# Patient Record
Sex: Female | Born: 1960 | Race: Black or African American | Hispanic: No | Marital: Married | Smoking: Never smoker
Health system: Southern US, Community
[De-identification: ages and names within clinical notes are randomized; demographics above are authoritative.]

## PROBLEM LIST (undated history)

## (undated) DIAGNOSIS — N952 Postmenopausal atrophic vaginitis: Secondary | ICD-10-CM

## (undated) DIAGNOSIS — E785 Hyperlipidemia, unspecified: Secondary | ICD-10-CM

## (undated) DIAGNOSIS — I10 Essential (primary) hypertension: Secondary | ICD-10-CM

## (undated) HISTORY — DX: Postmenopausal atrophic vaginitis: N95.2

## (undated) HISTORY — DX: Hyperlipidemia, unspecified: E78.5

---

## 1998-02-27 ENCOUNTER — Other Ambulatory Visit: Admission: RE | Admit: 1998-02-27 | Discharge: 1998-02-27 | Payer: Self-pay | Admitting: Obstetrics and Gynecology

## 2011-10-28 ENCOUNTER — Other Ambulatory Visit (HOSPITAL_COMMUNITY)
Admission: RE | Admit: 2011-10-28 | Discharge: 2011-10-28 | Disposition: A | Discharge status: Home / Self Care | Payer: Managed Care, Other (non HMO) | Source: Ambulatory Visit | Attending: Family Medicine | Admitting: Family Medicine

## 2011-10-28 DIAGNOSIS — Z0000 Encounter for general adult medical examination without abnormal findings: Secondary | ICD-10-CM | POA: Insufficient documentation

## 2012-02-19 ENCOUNTER — Emergency Department (INDEPENDENT_AMBULATORY_CARE_PROVIDER_SITE_OTHER): Payer: Managed Care, Other (non HMO)

## 2012-02-19 ENCOUNTER — Emergency Department (HOSPITAL_BASED_OUTPATIENT_CLINIC_OR_DEPARTMENT_OTHER)
Admission: EM | Admit: 2012-02-19 | Discharge: 2012-02-19 | Disposition: A | Discharge status: Home / Self Care | Payer: Managed Care, Other (non HMO) | Attending: Emergency Medicine | Admitting: Emergency Medicine

## 2012-02-19 ENCOUNTER — Encounter (HOSPITAL_BASED_OUTPATIENT_CLINIC_OR_DEPARTMENT_OTHER): Payer: Self-pay | Admitting: *Deleted

## 2012-02-19 DIAGNOSIS — R112 Nausea with vomiting, unspecified: Secondary | ICD-10-CM | POA: Insufficient documentation

## 2012-02-19 DIAGNOSIS — N1330 Unspecified hydronephrosis: Secondary | ICD-10-CM

## 2012-02-19 DIAGNOSIS — N390 Urinary tract infection, site not specified: Secondary | ICD-10-CM | POA: Insufficient documentation

## 2012-02-19 DIAGNOSIS — R109 Unspecified abdominal pain: Secondary | ICD-10-CM

## 2012-02-19 DIAGNOSIS — N2889 Other specified disorders of kidney and ureter: Secondary | ICD-10-CM

## 2012-02-19 HISTORY — DX: Essential (primary) hypertension: I10

## 2012-02-19 LAB — DIFFERENTIAL
Basophils Relative: 0 % (ref 0–1)
Eosinophils Absolute: 0 10*3/uL (ref 0.0–0.7)
Monocytes Absolute: 0.8 10*3/uL (ref 0.1–1.0)
Monocytes Relative: 7 % (ref 3–12)
Neutrophils Relative %: 84 % — ABNORMAL HIGH (ref 43–77)

## 2012-02-19 LAB — URINALYSIS, ROUTINE W REFLEX MICROSCOPIC
Glucose, UA: NEGATIVE mg/dL
Ketones, ur: 15 mg/dL — AB
Protein, ur: NEGATIVE mg/dL
Urobilinogen, UA: 0.2 mg/dL (ref 0.0–1.0)

## 2012-02-19 LAB — URINE MICROSCOPIC-ADD ON

## 2012-02-19 LAB — CBC
Hemoglobin: 11.8 g/dL — ABNORMAL LOW (ref 12.0–15.0)
MCH: 28 pg (ref 26.0–34.0)
MCHC: 34.5 g/dL (ref 30.0–36.0)

## 2012-02-19 LAB — COMPREHENSIVE METABOLIC PANEL
Albumin: 4.4 g/dL (ref 3.5–5.2)
BUN: 11 mg/dL (ref 6–23)
Creatinine, Ser: 0.7 mg/dL (ref 0.50–1.10)
Potassium: 3.5 mEq/L (ref 3.5–5.1)
Total Protein: 7.9 g/dL (ref 6.0–8.3)

## 2012-02-19 LAB — LIPASE, BLOOD: Lipase: 22 U/L (ref 11–59)

## 2012-02-19 MED ORDER — FENTANYL CITRATE 0.05 MG/ML IJ SOLN
50.0000 ug | Freq: Once | INTRAMUSCULAR | Status: AC
  Administered 2012-02-19: 50 ug via INTRAVENOUS
  Filled 2012-02-19: qty 2

## 2012-02-19 MED ORDER — TRAMADOL HCL 50 MG PO TABS: 50.0000 mg | ORAL_TABLET | Freq: Four times a day (QID) | ORAL | Status: AC | PRN

## 2012-02-19 MED ORDER — CIPROFLOXACIN HCL 500 MG PO TABS: 500.0000 mg | ORAL_TABLET | Freq: Two times a day (BID) | ORAL | Status: AC

## 2012-02-19 NOTE — ED Notes (Signed)
Patient having abd pain mid abd and right flank area. Patient has some relief after vomiting.

## 2012-02-19 NOTE — ED Provider Notes (Signed)
History     CSN: [id number]  Arrival date & time 02/19/12  0138   First MD Initiated Contact with Patient 02/19/12 0200      Chief Complaint  Patient presents with  . Abdominal Pain    (Consider location/radiation/quality/duration/timing/severity/associated sxs/prior treatment) Patient is a 51 y.o. female presenting with abdominal pain. The history is provided by the patient. No language interpreter was used.  Abdominal Pain The primary symptoms of the illness include abdominal pain, nausea and vomiting. The primary symptoms of the illness do not include shortness of breath. The current episode started 3 to 5 hours ago. The onset of the illness was sudden. The problem has been gradually improving.  The abdominal pain is located in the right flank. The abdominal pain radiates to the periumbilical region. The severity of the abdominal pain is 10/10 (current 4-5). The abdominal pain is relieved by vomiting. Exacerbated by: nothing, also had an episode yesterday that resolved.  The illness is associated with eating. The patient states that she believes she is currently not pregnant. The patient has had a change in bowel habit (loose stool). Symptoms associated with the illness do not include chills or anorexia. Significant associated medical issues do not include GERD.    Past Medical History  Diagnosis Date  . Hypertension     History reviewed. No pertinent past surgical history.  No family history on file.  History  Substance Use Topics  . Smoking status: Never Smoker   . Smokeless tobacco: Not on file  . Alcohol Use:     OB History    Grav Para Term Preterm Abortions TAB SAB Ect Mult Living                  Review of Systems  Constitutional: Negative for chills.  HENT: Negative.   Eyes: Negative.   Respiratory: Negative.  Negative for shortness of breath.   Cardiovascular: Negative.  Negative for chest pain.  Gastrointestinal: Positive for nausea, vomiting and  abdominal pain. Negative for anorexia.  Genitourinary: Negative.   Neurological: Negative.   Hematological: Negative.   Psychiatric/Behavioral: Negative.     Allergies  Review of patient's allergies indicates no known allergies.  Home Medications   Current Outpatient Rx  Name Route Sig Dispense Refill  . LISINOPRIL 20 MG PO TABS Oral Take 20 mg by mouth daily.      BP 155/80  Pulse 108  Temp(Src) 98.7 F (37.1 C) (Oral)  SpO2 100%  Physical Exam  Constitutional: She is oriented to person, place, and time. She appears well-developed and well-nourished. No distress.  HENT:  Head: Normocephalic and atraumatic.  Mouth/Throat: Oropharynx is clear and moist.  Eyes: Conjunctivae are normal. Pupils are equal, round, and reactive to light.  Neck: Normal range of motion. Neck supple.  Cardiovascular: Normal rate and regular rhythm.   Pulmonary/Chest: Effort normal and breath sounds normal. She has no wheezes. She has no rales.  Abdominal: Soft. Bowel sounds are normal. There is no rebound and no guarding.    Musculoskeletal: Normal range of motion.  Neurological: She is alert and oriented to person, place, and time.  Skin: Skin is warm and dry.  Psychiatric: She has a normal mood and affect.    ED Course  Procedures (including critical care time)  Labs Reviewed  URINALYSIS, ROUTINE W REFLEX MICROSCOPIC - Abnormal; Notable for the following:    Hgb urine dipstick TRACE (*)    Ketones, ur 15 (*)    Leukocytes, UA SMALL (*)  All other components within normal limits  URINE MICROSCOPIC-ADD ON - Abnormal; Notable for the following:    Squamous Epithelial / LPF FEW (*)    Bacteria, UA MANY (*)    All other components within normal limits  PREGNANCY, URINE  CBC  DIFFERENTIAL  COMPREHENSIVE METABOLIC PANEL  LIPASE, BLOOD   No results found.   No diagnosis found.    MDM  Patient informed of hydronephrosis and that possibility is passed kidney stone, reflux or less  likely neoplasm and the need to follow up with urology.  Patient and husband verbalize understanding and agree to follow up.  Return for intractable pain vomiting or any concerns.          Jasmine Awe, MD 02/19/12 [phone]

## 2014-10-01 ENCOUNTER — Encounter: Payer: Self-pay | Admitting: Oncology

## 2014-10-01 NOTE — Progress Notes (Signed)
Faxed fmla form to [employer] @ [phone] °

## 2015-12-08 DIAGNOSIS — M25531 Pain in right wrist: Secondary | ICD-10-CM | POA: Insufficient documentation

## 2016-08-15 DIAGNOSIS — F419 Anxiety disorder, unspecified: Secondary | ICD-10-CM | POA: Insufficient documentation

## 2016-08-20 ENCOUNTER — Emergency Department (HOSPITAL_BASED_OUTPATIENT_CLINIC_OR_DEPARTMENT_OTHER)
Admission: EM | Admit: 2016-08-20 | Discharge: 2016-08-20 | Disposition: A | Discharge status: Home / Self Care | Payer: Managed Care, Other (non HMO) | Attending: Emergency Medicine | Admitting: Emergency Medicine

## 2016-08-20 ENCOUNTER — Encounter (HOSPITAL_BASED_OUTPATIENT_CLINIC_OR_DEPARTMENT_OTHER): Payer: Self-pay

## 2016-08-20 DIAGNOSIS — Z79899 Other long term (current) drug therapy: Secondary | ICD-10-CM | POA: Insufficient documentation

## 2016-08-20 DIAGNOSIS — I10 Essential (primary) hypertension: Secondary | ICD-10-CM | POA: Diagnosis not present

## 2016-08-20 NOTE — ED Notes (Signed)
Patient is alert and oriented x3.  She was given DC instructions and follow up visit instructions.  Patient gave verbal understanding. She was DC ambulatory under her own power to home.  V/S stable.  He was not showing any signs of distress on DC 

## 2016-08-20 NOTE — ED Provider Notes (Signed)
   [facility] Provider Note: Lowella DellJ. Lane Thanvi Blincoe, MD, FACEP  CSN: [id number] MRN: [id number] ARRIVAL: 08/20/16 at 0131   CHIEF COMPLAINT  Hypertension   HISTORY OF PRESENT ILLNESS  Jennifer Mercer is a 55 y.o. female with a history of hypertension. She recently had her antihypertensive dose increased. She awoke this morning just prior to arrival with a sharp occipital headache. It was not severe. It resolved after about 2 minutes and she is now asymptomatic. She became concerned about her blood pressure and wanted it checked. It was 165/96 on arrival. It was rechecked at 159/91. She denies blurred vision, numbness, weakness, nausea, vomiting, diarrhea, chest pain or shortness of breath. She has been taking over-the-counter cough/cold medication recently.   Past Medical History:  Diagnosis Date  . Hypertension     History reviewed. No pertinent surgical history.  No family history on file.  Social History  Substance Use Topics  . Smoking status: Never Smoker  . Smokeless tobacco: Never Used  . Alcohol use No    Prior to Admission medications   Medication Sig Start Date End Date Taking? Authorizing Provider  lisinopril (PRINIVIL,ZESTRIL) 20 MG tablet Take 20 mg by mouth daily.    Historical Provider, MD    Allergies Review of patient's allergies indicates no known allergies.   REVIEW OF SYSTEMS  Negative except as noted here or in the History of Present Illness.   PHYSICAL EXAMINATION  Initial Vital Signs Blood pressure 165/96, pulse 65, temperature 98.1 F (36.7 C), temperature source Oral, height 5' 6.5" (1.689 m), weight 181 lb (82.1 kg), SpO2 100 %.  Examination General: Well-developed, well-nourished female in no acute distress; appearance consistent with age of record HENT: normocephalic; atraumatic Eyes: pupils equal, round and reactive to light; extraocular muscles intact Neck: supple Heart: regular rate and rhythm; Lungs: clear to auscultation  bilaterally Abdomen: soft; nondistended; nontender; bowel sounds present Extremities: No deformity; full range of motion; pulses normal Neurologic: Awake, alert and oriented; motor function intact in all extremities and symmetric; no facial droop Skin: Warm and dry Psychiatric: Normal mood and affect   RESULTS  Summary of this visit's results, reviewed by myself:   EKG Interpretation  Date/Time:    Ventricular Rate:    PR Interval:    QRS Duration:   QT Interval:    QTC Calculation:   R Axis:     Text Interpretation:        Laboratory Studies: No results found for this or any previous visit (from the past 24 hour(s)). Imaging Studies: No results found.  ED COURSE  Nursing notes and initial vitals signs, including pulse oximetry, reviewed.  Vitals:   08/20/16 0138  BP: 165/96  Pulse: 65  Temp: 98.1 F (36.7 C)  TempSrc: Oral  SpO2: 100%  Weight: 181 lb (82.1 kg)  Height: 5' 6.5" (1.689 m)    PROCEDURES    ED DIAGNOSES     ICD-9-CM ICD-10-CM   1. Hypertension not at goal 401.9 I10        Paula LibraJohn Ger Ringenberg, MD 08/20/16 [phone]

## 2016-08-20 NOTE — ED Triage Notes (Signed)
Pt states her PCP increased her b/p meds on 10/6, has been taking OTC cough/cold meds since Monday; pt states had a sharp pain to back of her head that woke her from her sleep lasting 2mins. Denies pain at this time just worried about her b/p; neg on stroke screen

## 2016-12-23 ENCOUNTER — Other Ambulatory Visit: Payer: Self-pay | Admitting: Nurse Practitioner

## 2016-12-23 DIAGNOSIS — Z1231 Encounter for screening mammogram for malignant neoplasm of breast: Secondary | ICD-10-CM

## 2016-12-27 ENCOUNTER — Ambulatory Visit
Admission: RE | Admit: 2016-12-27 | Discharge: 2016-12-27 | Disposition: A | Discharge status: Home / Self Care | Payer: Managed Care, Other (non HMO) | Source: Ambulatory Visit | Attending: Nurse Practitioner | Admitting: Nurse Practitioner

## 2016-12-27 DIAGNOSIS — Z1231 Encounter for screening mammogram for malignant neoplasm of breast: Secondary | ICD-10-CM

## 2018-10-19 ENCOUNTER — Ambulatory Visit: Payer: 59 | Admitting: Internal Medicine

## 2018-10-19 ENCOUNTER — Encounter: Payer: Self-pay | Admitting: Internal Medicine

## 2018-10-19 VITALS — BP 130/78 | HR 93 | Temp 98.5°F | Ht 67.0 in | Wt 181.8 lb

## 2018-10-19 DIAGNOSIS — I10 Essential (primary) hypertension: Secondary | ICD-10-CM | POA: Insufficient documentation

## 2018-10-19 DIAGNOSIS — M25561 Pain in right knee: Secondary | ICD-10-CM

## 2018-10-19 DIAGNOSIS — E663 Overweight: Secondary | ICD-10-CM | POA: Diagnosis not present

## 2018-10-19 DIAGNOSIS — M25562 Pain in left knee: Secondary | ICD-10-CM | POA: Diagnosis not present

## 2018-10-19 DIAGNOSIS — E782 Mixed hyperlipidemia: Secondary | ICD-10-CM

## 2018-10-19 DIAGNOSIS — Z1211 Encounter for screening for malignant neoplasm of colon: Secondary | ICD-10-CM | POA: Diagnosis not present

## 2018-10-19 DIAGNOSIS — Z0001 Encounter for general adult medical examination with abnormal findings: Secondary | ICD-10-CM

## 2018-10-19 DIAGNOSIS — Z0000 Encounter for general adult medical examination without abnormal findings: Secondary | ICD-10-CM | POA: Diagnosis not present

## 2018-10-19 LAB — POCT URINALYSIS DIPSTICK
Bilirubin, UA: NEGATIVE
Blood, UA: NEGATIVE
Glucose, UA: NEGATIVE
Ketones, UA: NEGATIVE
LEUKOCYTES UA: NEGATIVE
Nitrite, UA: NEGATIVE
PROTEIN UA: POSITIVE — AB
Spec Grav, UA: 1.015 (ref 1.010–1.025)
UROBILINOGEN UA: 1 U/dL
pH, UA: 7.5 (ref 5.0–8.0)

## 2018-10-19 LAB — POCT UA - MICROALBUMIN
Creatinine, POC: 300 mg/dL
MICROALBUMIN (UR) POC: 80 mg/L

## 2018-10-19 NOTE — Progress Notes (Signed)
Subjective:     Patient ID: Jennifer Mercer , female    DOB: [birth year] , 57 y.o.   MRN: [id number]   Chief Complaint  Patient presents with  . Annual Exam    HPI Pt is here  For annual wellness.  She is interested in loosing at least 10 lbs. Has questions about her diet. She does exercise, but not been doing this in the past month, since 67 friends have died. Had to cancel her mammogram doe to funerals, but plans to re-schedule it.  Her last pap was normal 2018.  Past Medical History:  Diagnosis Date  . Hypertension      Family History  Problem Relation Age of Onset  . Cancer Mother      Current Outpatient Medications:  .  atorvastatin (LIPITOR) 40 MG tablet, Take 40 mg by mouth daily., Disp: , Rfl:  .  losartan (COZAAR) 50 MG tablet, Take by mouth., Disp: , Rfl:    No Known Allergies   Review of Systems  Constitutional: Negative.   HENT: Negative.   Eyes: Negative.   Respiratory: Negative for cough, chest tightness and shortness of breath.   Cardiovascular: Negative for chest pain, palpitations and leg swelling.  Gastrointestinal: Negative.   Endocrine: Negative for cold intolerance, heat intolerance, polydipsia and polyphagia.  Genitourinary: Negative for difficulty urinating, dyspareunia, enuresis, frequency and pelvic pain.  Musculoskeletal: Negative for arthralgias, back pain, gait problem, joint swelling, myalgias, neck pain and neck stiffness.  Skin: Positive for rash. Negative for color change, pallor and wound.       L anterior chest dry patch appears when not having regular BM's, and resolves when regular.   Allergic/Immunologic: Negative for environmental allergies and food allergies.  Neurological: Negative for tremors, syncope, weakness, light-headedness, numbness and headaches.  Hematological: Negative for adenopathy. Does not bruise/bleed easily.  Psychiatric/Behavioral: Positive for decreased concentration. Negative for sleep disturbance. The patient is  nervous/anxious.        Lots of grieving. Has slight decreased concentration lately and now makes a list.    Today's Vitals   10/19/18 0948  BP: 130/78  Pulse: 93  Temp: 98.5 F (36.9 C)  TempSrc: Oral  SpO2: 97%  Weight: 181 lb 12.8 oz (82.5 kg)  Height: 5\' 7"  (1.702 m)   Body mass index is 28.47 kg/m.   Objective:  Physical Exam  BP 130/78 (BP Location: Left Arm, Patient Position: Sitting, Cuff Size: Normal)   Pulse 93   Temp 98.5 F (36.9 C) (Oral)   Ht 5\' 7"  (1.702 m)   Wt 181 lb 12.8 oz (82.5 kg)   SpO2 97%   BMI 28.47 kg/m  BP 130/78 (BP Location: Left Arm, Patient Position: Sitting, Cuff Size: Normal)   Pulse 93   Temp 98.5 F (36.9 C) (Oral)   Ht 5\' 7"  (1.702 m)   Wt 181 lb 12.8 oz (82.5 kg)   SpO2 97%   BMI 28.47 kg/m   General Appearance:    Alert, cooperative, no distress, appears stated age  Head:    Normocephalic, without obvious abnormality, atraumatic  Eyes:    PERRL, conjunctiva/corneas clear, EOM's intact, fundi    benign, both eyes  Ears:    Normal TM's and external ear canals, both ears  Nose:   Nares normal, septum midline, mucosa normal, no drainage    or sinus tenderness  Throat:   Lips, mucosa, and tongue normal; teeth and gums normal  Neck: Supple with no thyroidmegally or nodes  Back:     Symmetric, no curvature, ROM normal, no CVA tenderness  Lungs:     Clear to auscultation bilaterally, respirations unlabored  Chest Wall:    No tenderness or deformity   Heart:    Regular rate and rhythm, S1 and S2 normal, no murmur, rub   or gallop  Breast:      No masses, nipple deviation or nipple discharge  Abdomen:     Soft, non-tender, bowel sounds active all four quadrants,    no masses, no organomegaly  Pelvic:      Normal external genitalia, has atrophic vaginal changes and could not tolerate the speculum to look at her cervix, so I did not proceed. Bimanual revealed no adnexal fullness or tenderness of uterus or adnexal  Rectal:    Normal  tone,no masses or tenderness;   guaiac negative stool  Extremities:   Extremities normal, atraumatic, no cyanosis or edema. Both knees have mild crepitation and pain with ROM, L>R  Pulses:   2+ and symmetric all extremities  Skin:   Skin color, texture, turgor normal, no rashes or lesions  Lymph nodes:   Cervical, supraclavicular, and axillary nodes normal  Neurologic:   CNII-XII intact, normal strength, sensation and reflexes    Throughout. Normal Rhomberg, tandem gait, heel  And tip toe gait, and finger to nose.                                            Assessment And Plan:   1. Routine medical exam- routine. Fu 1y  2. Essential hypertension- chronic. FU 3 months. May continue same meds - CMP14 + Anion Gap - Lipid Profile - POCT Urinalysis Dipstick (81002) - POCT UA - Microalbumin  3. Overweight (BMI 25.0-29.9)- is going to be working on exercising. We discussed how to incorporate healthy fats in her diet and how do use them also in smoothies.  - TSH - T4, Free - T3, free  4. Mixed hyperlipidemia- chronic - Lipid Profile - TSH - T4, Free - T3, free  5. Screen for colon cancer- screen - POC Hemoccult Bld/Stl (1-Cd Office Dx)- neg  7- Bilateral Knee arthralgia- sent for bilateral knee xrays We will inform her of the results when done     [facility] LLCYLVIA RODRIGUEZ-SOUTHWORTH, PA-C

## 2018-10-19 NOTE — Patient Instructions (Addendum)
1- You may look into having salivary hormone test to check your estrogen, progesterone and even cortisol. If things are out of balance, Dr Sanders can prescribe Bio-identical hormones which are more natural.    Preventive Care 40-64 Years, Female Preventive care refers to lifestyle choices and visits with your health care provider that can promote health and wellness. What does preventive care include?  A yearly physical exam. This is also called an annual well check.  Dental exams once or twice a year.  Routine eye exams. Ask your health care provider how often you should have your eyes checked.  Personal lifestyle choices, including: ? Daily care of your teeth and gums. ? Regular physical activity. ? Eating a healthy diet. ? Avoiding tobacco and drug use. ? Limiting alcohol use. ? Practicing safe sex. ? Taking low-dose aspirin daily starting at age 50. ? Taking vitamin and mineral supplements as recommended by your health care provider. What happens during an annual well check? The services and screenings done by your health care provider during your annual well check will depend on your age, overall health, lifestyle risk factors, and family history of disease. Counseling Your health care provider may ask you questions about your:  Alcohol use.  Tobacco use.  Drug use.  Emotional well-being.  Home and relationship well-being.  Sexual activity.  Eating habits.  Work and work environment.  Method of birth control.  Menstrual cycle.  Pregnancy history.  Screening You may have the following tests or measurements:  Height, weight, and BMI.  Blood pressure.  Lipid and cholesterol levels. These may be checked every 5 years, or more frequently if you are over 50 years old.  Skin check.  Lung cancer screening. You may have this screening every year starting at age 55 if you have a 30-pack-year history of smoking and currently smoke or have quit within the past 15  years.  Fecal occult blood test (FOBT) of the stool. You may have this test every year starting at age 50.  Flexible sigmoidoscopy or colonoscopy. You may have a sigmoidoscopy every 5 years or a colonoscopy every 10 years starting at age 50.  Hepatitis C blood test.  Hepatitis B blood test.  Sexually transmitted disease (STD) testing.  Diabetes screening. This is done by checking your blood sugar (glucose) after you have not eaten for a while (fasting). You may have this done every 1-3 years.  Mammogram. This may be done every 1-2 years. Talk to your health care provider about when you should start having regular mammograms. This may depend on whether you have a family history of breast cancer.  BRCA-related cancer screening. This may be done if you have a family history of breast, ovarian, tubal, or peritoneal cancers.  Pelvic exam and Pap test. This may be done every 3 years starting at age 21. Starting at age 30, this may be done every 5 years if you have a Pap test in combination with an HPV test.  Bone density scan. This is done to screen for osteoporosis. You may have this scan if you are at high risk for osteoporosis.  Discuss your test results, treatment options, and if necessary, the need for more tests with your health care provider. Vaccines Your health care provider may recommend certain vaccines, such as:  Influenza vaccine. This is recommended every year.  Tetanus, diphtheria, and acellular pertussis (Tdap, Td) vaccine. You may need a Td booster every 10 years.  Varicella vaccine. You may need this if   you have not been vaccinated.  Zoster vaccine. You may need this after age 60.  Measles, mumps, and rubella (MMR) vaccine. You may need at least one dose of MMR if you were born in 1957 or later. You may also need a second dose.  Pneumococcal 13-valent conjugate (PCV13) vaccine. You may need this if you have certain conditions and were not previously  vaccinated.  Pneumococcal polysaccharide (PPSV23) vaccine. You may need one or two doses if you smoke cigarettes or if you have certain conditions.  Meningococcal vaccine. You may need this if you have certain conditions.  Hepatitis A vaccine. You may need this if you have certain conditions or if you travel or work in places where you may be exposed to hepatitis A.  Hepatitis B vaccine. You may need this if you have certain conditions or if you travel or work in places where you may be exposed to hepatitis B.  Haemophilus influenzae type b (Hib) vaccine. You may need this if you have certain conditions.  Talk to your health care provider about which screenings and vaccines you need and how often you need them. This information is not intended to replace advice given to you by your health care provider. Make sure you discuss any questions you have with your health care provider. Document Released: 11/21/2015 Document Revised: 07/14/2016 Document Reviewed: 08/26/2015 Elsevier Interactive Patient Education  2018 Elsevier Inc.  

## 2018-10-20 LAB — CMP14 + ANION GAP
A/G RATIO: 1.9 (ref 1.2–2.2)
ALBUMIN: 5.1 g/dL (ref 3.5–5.5)
ALT: 23 IU/L (ref 0–32)
AST: 20 IU/L (ref 0–40)
Alkaline Phosphatase: 104 IU/L (ref 39–117)
Anion Gap: 15 mmol/L (ref 10.0–18.0)
BUN / CREAT RATIO: 17 (ref 9–23)
BUN: 12 mg/dL (ref 6–24)
Bilirubin Total: 1.4 mg/dL — ABNORMAL HIGH (ref 0.0–1.2)
CALCIUM: 10.1 mg/dL (ref 8.7–10.2)
CO2: 24 mmol/L (ref 20–29)
Chloride: 102 mmol/L (ref 96–106)
Creatinine, Ser: 0.72 mg/dL (ref 0.57–1.00)
GFR calc Af Amer: 107 mL/min/{1.73_m2} (ref >59)
GFR, EST NON AFRICAN AMERICAN: 93 mL/min/{1.73_m2} (ref >59)
GLOBULIN, TOTAL: 2.7 g/dL (ref 1.5–4.5)
GLUCOSE: 93 mg/dL (ref 65–99)
POTASSIUM: 4 mmol/L (ref 3.5–5.2)
Sodium: 141 mmol/L (ref 134–144)
TOTAL PROTEIN: 7.8 g/dL (ref 6.0–8.5)

## 2018-10-20 LAB — LIPID PANEL
CHOL/HDL RATIO: 3.2 ratio (ref 0.0–4.4)
Cholesterol, Total: 196 mg/dL (ref 100–199)
HDL: 61 mg/dL (ref >39)
LDL Calculated: 109 mg/dL — ABNORMAL HIGH (ref 0–99)
Triglycerides: 129 mg/dL (ref 0–149)
VLDL Cholesterol Cal: 26 mg/dL (ref 5–40)

## 2018-10-20 LAB — TSH: TSH: 1.53 u[IU]/mL (ref 0.450–4.500)

## 2018-10-20 LAB — T3, FREE: T3, Free: 3.2 pg/mL (ref 2.0–4.4)

## 2018-10-20 LAB — T4, FREE: FREE T4: 1.08 ng/dL (ref 0.82–1.77)

## 2018-10-24 LAB — POC HEMOCCULT BLD/STL (OFFICE/1-CARD/DIAGNOSTIC): Fecal Occult Blood, POC: NEGATIVE

## 2018-11-06 ENCOUNTER — Other Ambulatory Visit: Payer: Self-pay | Admitting: Internal Medicine

## 2018-11-06 DIAGNOSIS — Z1231 Encounter for screening mammogram for malignant neoplasm of breast: Secondary | ICD-10-CM

## 2019-01-09 ENCOUNTER — Ambulatory Visit: Payer: 59 | Admitting: Internal Medicine

## 2019-01-09 ENCOUNTER — Encounter: Payer: Self-pay | Admitting: Internal Medicine

## 2019-01-09 ENCOUNTER — Ambulatory Visit
Admission: RE | Admit: 2019-01-09 | Discharge: 2019-01-09 | Disposition: A | Discharge status: Home / Self Care | Payer: 59 | Source: Ambulatory Visit | Attending: Internal Medicine | Admitting: Internal Medicine

## 2019-01-09 VITALS — BP 146/82 | HR 99 | Ht 67.0 in | Wt 187.2 lb

## 2019-01-09 DIAGNOSIS — R5383 Other fatigue: Secondary | ICD-10-CM

## 2019-01-09 DIAGNOSIS — E559 Vitamin D deficiency, unspecified: Secondary | ICD-10-CM | POA: Diagnosis not present

## 2019-01-09 DIAGNOSIS — I10 Essential (primary) hypertension: Secondary | ICD-10-CM | POA: Diagnosis not present

## 2019-01-09 DIAGNOSIS — Z1231 Encounter for screening mammogram for malignant neoplasm of breast: Secondary | ICD-10-CM

## 2019-01-09 DIAGNOSIS — R7309 Other abnormal glucose: Secondary | ICD-10-CM | POA: Diagnosis not present

## 2019-01-09 MED ORDER — LOSARTAN POTASSIUM 50 MG PO TABS: 50.0000 mg | ORAL_TABLET | Freq: Every day | ORAL | 1 refills | Status: DC

## 2019-01-09 NOTE — Progress Notes (Signed)
Subjective:     Patient ID: Jennifer Mercer , female    DOB: 1961-04-08 , 58 y.o.   MRN: [id number]   Chief Complaint  Patient presents with  . Hypertension    follow up htn, feeling tried , wants her vit d checked     HPI  Hypertension  This is a chronic problem. The current episode started more than 1 year ago. The problem has been gradually improving since onset. The problem is uncontrolled. Pertinent negatives include no blurred vision, chest pain, palpitations or shortness of breath. Risk factors for coronary artery disease include dyslipidemia and post-menopausal state. Past treatments include angiotensin blockers.  she has been taking 1/2 losartan daily.    Past Medical History:  Diagnosis Date  . Atrophic vaginitis   . Hyperlipidemia   . Hypertension      Family History  Problem Relation Age of Onset  . Cancer Mother      Current Outpatient Medications:  .  atorvastatin (LIPITOR) 40 MG tablet, Take 40 mg by mouth daily., Disp: , Rfl:  .  losartan (COZAAR) 50 MG tablet, Take 1 tablet (50 mg total) by mouth daily., Disp: 90 tablet, Rfl: 1 .  NON FORMULARY, Women's ultra mega energy & metabolism, Disp: , Rfl:    No Known Allergies   Review of Systems  Constitutional: Positive for fatigue (she has been more tired than usual. wants to have vit d level checked).  Eyes: Negative for blurred vision.  Respiratory: Negative.  Negative for shortness of breath.   Cardiovascular: Negative.  Negative for chest pain and palpitations.  Gastrointestinal: Negative.   Neurological: Negative.   Psychiatric/Behavioral: Negative.      Today's Vitals   01/09/19 1120  BP: (!) 146/82  Pulse: 99  SpO2: 96%  Weight: 187 lb 3.2 oz (84.9 kg)  Height: 5\' 7"  (1.702 m)   Body mass index is 29.32 kg/m.   Objective:  Physical Exam Vitals signs and nursing note reviewed.  Constitutional:      Appearance: Normal appearance.  HENT:     Head: Normocephalic and atraumatic.   Cardiovascular:     Rate and Rhythm: Normal rate and regular rhythm.     Heart sounds: Normal heart sounds.  Pulmonary:     Effort: Pulmonary effort is normal.     Breath sounds: Normal breath sounds.  Skin:    General: Skin is warm.  Neurological:     General: No focal deficit present.     Mental Status: She is alert.  Psychiatric:        Mood and Affect: Mood normal.        Behavior: Behavior normal.         Assessment And Plan:     1. Essential hypertension, benign  Fair control. She was been advised to increase her losartan to 50mg  - one tab daily. She had been taking 1/2 tab daily. She is also encouraged to take meds daily and to incorporate more exercise into her daily routine. She will rto in six weeks for re-evaluation. I will check a bmet at that time.   2. Fatigue, unspecified type  She is encouraged to stay well hydrated. Labs from Dec 2019 physical were reviewed in full detail. Sx could be related to elevated blood pressure.   - CBC no Diff  3. Other abnormal glucose  HER A1C HAS BEEN ELEVATED IN THE PAST. I WILL CHECK AN A1C, BMET TODAY. SHE WAS ENCOURAGED TO AVOID SUGARY BEVERAGES AND PROCESSED  FOODS INCLUDNG BREADS, RICE AND PASTA.   4. Vitamin D deficiency, unspecified  I WILL CHECK A VIT D LEVEL AND SUPPLEMENT AS NEEDED.  ALSO ENCOURAGED TO SPEND 15 MINUTES IN THE SUN DAILY.  - Vitamin D (25 hydroxy)        Gwynneth Aliment, MD

## 2019-01-10 LAB — CBC
Hematocrit: 41.3 % (ref 34.0–46.6)
Hemoglobin: 14.6 g/dL (ref 11.1–15.9)
MCH: 30.2 pg (ref 26.6–33.0)
MCHC: 35.4 g/dL (ref 31.5–35.7)
MCV: 85 fL (ref 79–97)
PLATELETS: 171 10*3/uL (ref 150–450)
RBC: 4.84 x10E6/uL (ref 3.77–5.28)
RDW: 12.8 % (ref 11.7–15.4)
WBC: 5.2 10*3/uL (ref 3.4–10.8)

## 2019-01-10 LAB — VITAMIN D 25 HYDROXY (VIT D DEFICIENCY, FRACTURES): VIT D 25 HYDROXY: 45.8 ng/mL (ref 30.0–100.0)

## 2019-01-18 ENCOUNTER — Ambulatory Visit: Payer: 59 | Admitting: Internal Medicine

## 2019-02-05 ENCOUNTER — Other Ambulatory Visit: Payer: Self-pay | Admitting: Internal Medicine

## 2019-06-20 ENCOUNTER — Other Ambulatory Visit: Payer: Self-pay | Admitting: Internal Medicine

## 2019-07-17 ENCOUNTER — Other Ambulatory Visit: Payer: Self-pay | Admitting: Internal Medicine

## 2019-08-20 ENCOUNTER — Other Ambulatory Visit: Payer: Self-pay | Admitting: Internal Medicine

## 2019-08-23 ENCOUNTER — Other Ambulatory Visit: Payer: Self-pay

## 2019-08-23 ENCOUNTER — Encounter: Payer: Self-pay | Admitting: Internal Medicine

## 2019-08-23 ENCOUNTER — Ambulatory Visit: Payer: 59 | Admitting: Internal Medicine

## 2019-08-23 VITALS — BP 160/98 | HR 117 | Temp 98.9°F | Ht 67.0 in | Wt 190.0 lb

## 2019-08-23 DIAGNOSIS — R002 Palpitations: Secondary | ICD-10-CM

## 2019-08-23 DIAGNOSIS — Z23 Encounter for immunization: Secondary | ICD-10-CM | POA: Diagnosis not present

## 2019-08-23 DIAGNOSIS — I10 Essential (primary) hypertension: Secondary | ICD-10-CM | POA: Diagnosis not present

## 2019-08-23 DIAGNOSIS — R7309 Other abnormal glucose: Secondary | ICD-10-CM

## 2019-08-23 DIAGNOSIS — E7800 Pure hypercholesterolemia, unspecified: Secondary | ICD-10-CM

## 2019-08-23 DIAGNOSIS — E663 Overweight: Secondary | ICD-10-CM

## 2019-08-23 MED ORDER — METOPROLOL SUCCINATE ER 25 MG PO TB24: 25.0000 mg | ORAL_TABLET | Freq: Every day | ORAL | 1 refills | Status: DC

## 2019-08-23 NOTE — Patient Instructions (Addendum)
START MAGNESIUM (CALM) ALONG WITH TOPROL XL (METOPROLOL) TONIGHT 9-10PM   Generalized Anxiety Disorder, Adult Generalized anxiety disorder (GAD) is a mental health disorder. People with this condition constantly worry about everyday events. Unlike normal anxiety, worry related to GAD is not triggered by a specific event. These worries also do not fade or get better with time. GAD interferes with life functions, including relationships, work, and school. GAD can vary from mild to severe. People with severe GAD can have intense waves of anxiety with physical symptoms (panic attacks). What are the causes? The exact cause of GAD is not known. What increases the risk? This condition is more likely to develop in:  Women.  People who have a family history of anxiety disorders.  People who are very shy.  People who experience very stressful life events, such as the death of a loved one.  People who have a very stressful family environment. What are the signs or symptoms? People with GAD often worry excessively about many things in their lives, such as their health and family. They may also be overly concerned about:  Doing well at work.  Being on time.  Natural disasters.  Friendships. Physical symptoms of GAD include:  Fatigue.  Muscle tension or having muscle twitches.  Trembling or feeling shaky.  Being easily startled.  Feeling like your heart is pounding or racing.  Feeling out of breath or like you cannot take a deep breath.  Having trouble falling asleep or staying asleep.  Sweating.  Nausea, diarrhea, or irritable bowel syndrome (IBS).  Headaches.  Trouble concentrating or remembering facts.  Restlessness.  Irritability. How is this diagnosed? Your health care provider can diagnose GAD based on your symptoms and medical history. You will also have a physical exam. The health care provider will ask specific questions about your symptoms, including how severe  they are, when they started, and if they come and go. Your health care provider may ask you about your use of alcohol or drugs, including prescription medicines. Your health care provider may refer you to a mental health specialist for further evaluation. Your health care provider will do a thorough examination and may perform additional tests to rule out other possible causes of your symptoms. To be diagnosed with GAD, a person must have anxiety that:  Is out of his or her control.  Affects several different aspects of his or her life, such as work and relationships.  Causes distress that makes him or her unable to take part in normal activities.  Includes at least three physical symptoms of GAD, such as restlessness, fatigue, trouble concentrating, irritability, muscle tension, or sleep problems. Before your health care provider can confirm a diagnosis of GAD, these symptoms must be present more days than they are not, and they must last for six months or longer. How is this treated? The following therapies are usually used to treat GAD:  Medicine. Antidepressant medicine is usually prescribed for long-term daily control. Antianxiety medicines may be added in severe cases, especially when panic attacks occur.  Talk therapy (psychotherapy). Certain types of talk therapy can be helpful in treating GAD by providing support, education, and guidance. Options include: ? Cognitive behavioral therapy (CBT). People learn coping skills and techniques to ease their anxiety. They learn to identify unrealistic or negative thoughts and behaviors and to replace them with positive ones. ? Acceptance and commitment therapy (ACT). This treatment teaches people how to be mindful as a way to cope with unwanted thoughts and  feelings. ? Biofeedback. This process trains you to manage your body's response (physiological response) through breathing techniques and relaxation methods. You will work with a therapist while  machines are used to monitor your physical symptoms.  Stress management techniques. These include yoga, meditation, and exercise. A mental health specialist can help determine which treatment is best for you. Some people see improvement with one type of therapy. However, other people require a combination of therapies. Follow these instructions at home:  Take over-the-counter and prescription medicines only as told by your health care provider.  Try to maintain a normal routine.  Try to anticipate stressful situations and allow extra time to manage them.  Practice any stress management or self-calming techniques as taught by your health care provider.  Do not punish yourself for setbacks or for not making progress.  Try to recognize your accomplishments, even if they are small.  Keep all follow-up visits as told by your health care provider. This is important. Contact a health care provider if:  Your symptoms do not get better.  Your symptoms get worse.  You have signs of depression, such as: ? A persistently sad, cranky, or irritable mood. ? Loss of enjoyment in activities that used to bring you joy. ? Change in weight or eating. ? Changes in sleeping habits. ? Avoiding friends or family members. ? Loss of energy for normal tasks. ? Feelings of guilt or worthlessness. Get help right away if:  You have serious thoughts about hurting yourself or others. If you ever feel like you may hurt yourself or others, or have thoughts about taking your own life, get help right away. You can go to your nearest emergency department or call:  Your local emergency services (911 in the [place]  A suicide crisis helpline, such as the [facility] at 603-418-1357. This is open 24 hours a day. Summary  Generalized anxiety disorder (GAD) is a mental health disorder that involves worry that is not triggered by a specific event.  People with GAD often worry excessively  about many things in their lives, such as their health and family.  GAD may cause physical symptoms such as restlessness, trouble concentrating, sleep problems, frequent sweating, nausea, diarrhea, headaches, and trembling or muscle twitching.  A mental health specialist can help determine which treatment is best for you. Some people see improvement with one type of therapy. However, other people require a combination of therapies. This information is not intended to replace advice given to you by your health care provider. Make sure you discuss any questions you have with your health care provider. Document Released: 02/19/2013 Document Revised: 10/07/2017 Document Reviewed: 09/14/2016 Elsevier Patient Education  2020 [employer].

## 2019-08-24 LAB — HEMOGLOBIN A1C
Est. average glucose Bld gHb Est-mCnc: 126 mg/dL
Hgb A1c MFr Bld: 6 % — ABNORMAL HIGH (ref 4.8–5.6)

## 2019-08-24 LAB — CMP14+EGFR
ALT: 26 IU/L (ref 0–32)
AST: 24 IU/L (ref 0–40)
Albumin/Globulin Ratio: 1.9 (ref 1.2–2.2)
Albumin: 5.2 g/dL — ABNORMAL HIGH (ref 3.8–4.9)
Alkaline Phosphatase: 111 IU/L (ref 39–117)
BUN/Creatinine Ratio: 15 (ref 9–23)
BUN: 12 mg/dL (ref 6–24)
Bilirubin Total: 0.9 mg/dL (ref 0.0–1.2)
CO2: 25 mmol/L (ref 20–29)
Calcium: 10.3 mg/dL — ABNORMAL HIGH (ref 8.7–10.2)
Chloride: 100 mmol/L (ref 96–106)
Creatinine, Ser: 0.78 mg/dL (ref 0.57–1.00)
GFR calc Af Amer: 96 mL/min/{1.73_m2} (ref >59)
GFR calc non Af Amer: 83 mL/min/{1.73_m2} (ref >59)
Globulin, Total: 2.7 g/dL (ref 1.5–4.5)
Glucose: 105 mg/dL — ABNORMAL HIGH (ref 65–99)
Potassium: 3.7 mmol/L (ref 3.5–5.2)
Sodium: 139 mmol/L (ref 134–144)
Total Protein: 7.9 g/dL (ref 6.0–8.5)

## 2019-08-24 LAB — LIPID PANEL
Chol/HDL Ratio: 3.2 ratio (ref 0.0–4.4)
Cholesterol, Total: 199 mg/dL (ref 100–199)
HDL: 63 mg/dL (ref >39)
LDL Chol Calc (NIH): 113 mg/dL — ABNORMAL HIGH (ref 0–99)
Triglycerides: 130 mg/dL (ref 0–149)
VLDL Cholesterol Cal: 23 mg/dL (ref 5–40)

## 2019-08-24 LAB — MAGNESIUM: Magnesium: 1.9 mg/dL (ref 1.6–2.3)

## 2019-08-24 LAB — TSH: TSH: 2.22 u[IU]/mL (ref 0.450–4.500)

## 2019-08-25 ENCOUNTER — Encounter: Payer: Self-pay | Admitting: Internal Medicine

## 2019-09-01 NOTE — Progress Notes (Signed)
Subjective:     Patient ID: Jennifer Mercer , female    DOB: 01-18-1961 , 58 y.o.   MRN: [id number]   Chief Complaint  Patient presents with  . Hypertension    HPI  Hypertension This is a chronic problem. The current episode started more than 1 year ago. The problem has been gradually improving since onset. The problem is uncontrolled. Associated symptoms include palpitations. Pertinent negatives include no blurred vision, chest pain or shortness of breath. Risk factors for coronary artery disease include dyslipidemia and post-menopausal state. Past treatments include angiotensin blockers.     Past Medical History:  Diagnosis Date  . Atrophic vaginitis   . Hyperlipidemia   . Hypertension      Family History  Problem Relation Age of Onset  . Cancer Mother      Current Outpatient Medications:  .  Ascorbic Acid (VITAMIN C) 1000 MG tablet, Take 1,000 mg by mouth daily., Disp: , Rfl:  .  atorvastatin (LIPITOR) 40 MG tablet, Take 1 tablet by mouth once daily, Disp: 90 tablet, Rfl: 0 .  losartan (COZAAR) 50 MG tablet, TAKE 1 TABLET BY MOUTH ONCE DAILY . APPOINTMENT REQUIRED FOR FUTURE REFILLS, Disp: 30 tablet, Rfl: 0 .  NON FORMULARY, Women's ultra mega energy & metabolism, Disp: , Rfl:  .  Zinc Sulfate (ZINC 15 PO), Take by mouth., Disp: , Rfl:  .  metoprolol succinate (TOPROL XL) 25 MG 24 hr tablet, Take 1 tablet (25 mg total) by mouth daily., Disp: 30 tablet, Rfl: 1   No Known Allergies   Review of Systems  Constitutional: Negative.   Eyes: Negative for blurred vision.  Respiratory: Negative.  Negative for shortness of breath.   Cardiovascular: Positive for palpitations. Negative for chest pain.  Gastrointestinal: Negative.   Neurological: Negative.   Psychiatric/Behavioral: Negative.      Today's Vitals   08/23/19 1637  BP: (!) 160/98  Pulse: (!) 117  Temp: 98.9 F (37.2 C)  TempSrc: Oral  SpO2: 98%  Weight: 190 lb (86.2 kg)  Height: 5' 7" (1.702 m)   Body  mass index is 29.76 kg/m.   Objective:  Physical Exam Vitals signs and nursing note reviewed.  Constitutional:      Appearance: Normal appearance.  HENT:     Head: Normocephalic and atraumatic.  Cardiovascular:     Rate and Rhythm: Regular rhythm. Tachycardia present.     Heart sounds: Normal heart sounds.  Pulmonary:     Effort: Pulmonary effort is normal.     Breath sounds: Normal breath sounds.  Skin:    General: Skin is warm.  Neurological:     General: No focal deficit present.     Mental Status: She is alert.  Psychiatric:        Mood and Affect: Mood normal.        Behavior: Behavior normal.         Assessment And Plan:     1. Essential hypertension, benign  Chronic, uncontrolled. I will add metoprolol ER 40m nightly to her current regimen. I will also check electrolytes today. She will rto in four weeks for re-evaluation. She is also encouraged to follow a low sodium diet.   - CMP14+EGFR  2. Other abnormal glucose  HER A1C HAS BEEN ELEVATED IN THE PAST. I WILL CHECK AN A1C, BMET TODAY. SHE WAS ENCOURAGED TO AVOID SUGARY BEVERAGES AND PROCESSED FOODS INCLUDNG BREADS, RICE AND PASTA.  - Hemoglobin A1c  3. Palpitations  EKG performed, no arrhythmia  noted. She is advised to start Calm, a magnesium supplement nightly. She will take as directed on the packaging. She is advised to let me know if her sx persist, and to not wait until her next appt to give me an update.   - Magnesium - EKG 12-Lead  4. Pure hypercholesterolemia  Chronic. I will check non-fasting lipid panel and LFTs today.  She is encouraged to avoid fried foods and to incorporate more exercise into her daily routine.   - Lipid panel - TSH  5. Overweight (BMI 25.0-29.9)  She is encouraged to strive for BMI less than 26 to decrease cardiac risk.   6. Need for influenza vaccination  - Flu Vaccine QUAD 6+ mos PF IM (Fluarix Quad PF)        Robyn N Sanders, MD    THE PATIENT IS  ENCOURAGED TO PRACTICE SOCIAL DISTANCING DUE TO THE COVID-19 PANDEMIC.   

## 2019-09-10 ENCOUNTER — Other Ambulatory Visit: Payer: Self-pay

## 2019-09-10 ENCOUNTER — Ambulatory Visit: Payer: 59 | Admitting: Internal Medicine

## 2019-09-10 ENCOUNTER — Encounter: Payer: Self-pay | Admitting: Internal Medicine

## 2019-09-10 VITALS — BP 124/82 | HR 76 | Temp 98.3°F | Ht 67.0 in | Wt 191.4 lb

## 2019-09-10 DIAGNOSIS — R002 Palpitations: Secondary | ICD-10-CM | POA: Diagnosis not present

## 2019-09-10 DIAGNOSIS — I10 Essential (primary) hypertension: Secondary | ICD-10-CM

## 2019-09-10 DIAGNOSIS — Z6829 Body mass index (BMI) 29.0-29.9, adult: Secondary | ICD-10-CM | POA: Diagnosis not present

## 2019-09-10 DIAGNOSIS — E663 Overweight: Secondary | ICD-10-CM | POA: Diagnosis not present

## 2019-09-10 NOTE — Patient Instructions (Signed)

## 2019-09-10 NOTE — Progress Notes (Signed)
Subjective:     Patient ID: Jennifer Mercer , female    DOB: 1961-09-14 , 58 y.o.   MRN: [id number]   Chief Complaint  Patient presents with  . Hypertension    HPI  She is here today for f/u htn and palpitations. She was started on metoprolol ER 25mg  nightly at her last visit. She has tolerated the medication without any issues. She has also started the magnesium supplementation. She reports feeling much better and calmer. The severity and frequency of the palpitations has lessened greatly as well.  She reports only having palpitations when she feels anxious. She does admit that the magnesium has lessened her sx of anxiety.  Hypertension This is a chronic problem. The current episode started more than 1 year ago. The problem has been gradually improving since onset. The problem is controlled. Pertinent negatives include no blurred vision, chest pain, palpitations or shortness of breath. Past treatments include beta blockers and angiotensin blockers. The current treatment provides moderate improvement.     Past Medical History:  Diagnosis Date  . Atrophic vaginitis   . Hyperlipidemia   . Hypertension      Family History  Problem Relation Age of Onset  . Cancer Mother      Current Outpatient Medications:  .  Ascorbic Acid (VITAMIN C) 1000 MG tablet, Take 1,000 mg by mouth daily., Disp: , Rfl:  .  atorvastatin (LIPITOR) 40 MG tablet, Take 1 tablet by mouth once daily, Disp: 90 tablet, Rfl: 0 .  ECHINACEA PO, Take by mouth., Disp: , Rfl:  .  losartan (COZAAR) 50 MG tablet, TAKE 1 TABLET BY MOUTH ONCE DAILY . APPOINTMENT REQUIRED FOR FUTURE REFILLS, Disp: 30 tablet, Rfl: 0 .  metoprolol succinate (TOPROL XL) 25 MG 24 hr tablet, Take 1 tablet (25 mg total) by mouth daily., Disp: 30 tablet, Rfl: 1 .  NON FORMULARY, Women's ultra mega energy & metabolism, Disp: , Rfl:  .  Zinc Sulfate (ZINC 15 PO), Take by mouth., Disp: , Rfl:    No Known Allergies   Review of Systems   Constitutional: Negative.   Eyes: Negative for blurred vision.  Respiratory: Negative.  Negative for shortness of breath.   Cardiovascular: Negative.  Negative for chest pain and palpitations.  Gastrointestinal: Negative.   Neurological: Negative.   Psychiatric/Behavioral: Negative.      Today's Vitals   09/10/19 1628  BP: 124/82  Pulse: 76  Temp: 98.3 F (36.8 C)  TempSrc: Oral  Weight: 191 lb 6.4 oz (86.8 kg)  Height: 5\' 7"  (1.702 m)   Body mass index is 29.98 kg/m.   Objective:  Physical Exam Vitals signs and nursing note reviewed.  Constitutional:      Appearance: Normal appearance.  HENT:     Head: Normocephalic and atraumatic.  Cardiovascular:     Rate and Rhythm: Normal rate and regular rhythm.     Heart sounds: Normal heart sounds.  Pulmonary:     Effort: Pulmonary effort is normal.     Breath sounds: Normal breath sounds.  Skin:    General: Skin is warm.  Neurological:     General: No focal deficit present.     Mental Status: She is alert.  Psychiatric:        Mood and Affect: Mood normal.        Behavior: Behavior normal.         Assessment And Plan:     1. Essential hypertension, benign  Chronic, improved. She will continue with  current meds. She will rto in four months for re-evaluation. She was also given information on the DASH eating plan.   2. Palpitations  Improved. She will continue with magnesium supplementation and metoprolol nightly.   3. Overweight with body mass index (BMI) of 29 to 29.9 in adult  She is encouraged to strive for BMI less than 25 to decrease cardiac risk. She is advised to strive for 150 minutes of exercise per week.   Gwynneth Aliment, MD    THE PATIENT IS ENCOURAGED TO PRACTICE SOCIAL DISTANCING DUE TO THE COVID-19 PANDEMIC.

## 2019-09-13 ENCOUNTER — Ambulatory Visit: Payer: 59 | Admitting: Internal Medicine

## 2019-09-20 ENCOUNTER — Other Ambulatory Visit: Payer: Self-pay | Admitting: Internal Medicine

## 2019-10-02 ENCOUNTER — Telehealth: Payer: Self-pay

## 2019-10-02 NOTE — Telephone Encounter (Signed)
Called pt per Dr. Baird Cancer to see if pt has been taking her Atorvastatin 40mg  as prescribed. Per patient she has been taking her medication, she is not using [facility] she uses [facility]

## 2019-10-18 ENCOUNTER — Other Ambulatory Visit: Payer: Self-pay | Admitting: Internal Medicine

## 2019-10-26 ENCOUNTER — Other Ambulatory Visit: Payer: Self-pay | Admitting: Internal Medicine

## 2019-11-13 ENCOUNTER — Ambulatory Visit (INDEPENDENT_AMBULATORY_CARE_PROVIDER_SITE_OTHER): Payer: Managed Care, Other (non HMO) | Admitting: Internal Medicine

## 2019-11-13 ENCOUNTER — Other Ambulatory Visit: Payer: Self-pay

## 2019-11-13 ENCOUNTER — Encounter: Payer: Self-pay | Admitting: Internal Medicine

## 2019-11-13 VITALS — BP 142/80 | HR 109 | Temp 98.3°F | Ht 67.0 in | Wt 194.2 lb

## 2019-11-13 DIAGNOSIS — M545 Low back pain, unspecified: Secondary | ICD-10-CM

## 2019-11-13 DIAGNOSIS — I10 Essential (primary) hypertension: Secondary | ICD-10-CM

## 2019-11-13 DIAGNOSIS — M25551 Pain in right hip: Secondary | ICD-10-CM | POA: Diagnosis not present

## 2019-11-13 MED ORDER — LOSARTAN POTASSIUM 50 MG PO TABS: 50.0000 mg | ORAL_TABLET | Freq: Every day | ORAL | 2 refills | Status: DC

## 2019-11-13 NOTE — Patient Instructions (Signed)
[facility], Dr. Tito Dine Rodriguez/ Chiropractor on [address]   Acute Back Pain, Adult Acute back pain is sudden and usually short-lived. It is often caused by an injury to the muscles and tissues in the back. The injury may result from:  A muscle or ligament getting overstretched or torn (strained). Ligaments are tissues that connect bones to each other. Lifting something improperly can cause a back strain.  Wear and tear (degeneration) of the spinal disks. Spinal disks are circular tissue that provides cushioning between the bones of the spine (vertebrae).  Twisting motions, such as while playing sports or doing yard work.  A hit to the back.  Arthritis. You may have a physical exam, lab tests, and imaging tests to find the cause of your pain. Acute back pain usually goes away with rest and home care. Follow these instructions at home: Managing pain, stiffness, and swelling  Take over-the-counter and prescription medicines only as told by your health care provider.  Your health care provider may recommend applying ice during the first 24-48 hours after your pain starts. To do this: ? Put ice in a plastic bag. ? Place a towel between your skin and the bag. ? Leave the ice on for 20 minutes, 2-3 times a day.  If directed, apply heat to the affected area as often as told by your health care provider. Use the heat source that your health care provider recommends, such as a moist heat pack or a heating pad. ? Place a towel between your skin and the heat source. ? Leave the heat on for 20-30 minutes. ? Remove the heat if your skin turns bright red. This is especially important if you are unable to feel pain, heat, or cold. You have a greater risk of getting burned. Activity   Do not stay in bed. Staying in bed for more than 1-2 days can delay your recovery.  Sit up and stand up straight. Avoid leaning forward when you sit, or hunching over when you stand. ? If you work at a  desk, sit close to it so you do not need to lean over. Keep your chin tucked in. Keep your neck drawn back, and keep your elbows bent at a right angle. Your arms should look like the letter "L." ? Sit high and close to the steering wheel when you drive. Add lower back (lumbar) support to your car seat, if needed.  Take short walks on even surfaces as soon as you are able. Try to increase the length of time you walk each day.  Do not sit, drive, or stand in one place for more than 30 minutes at a time. Sitting or standing for long periods of time can put stress on your back.  Do not drive or use heavy machinery while taking prescription pain medicine.  Use proper lifting techniques. When you bend and lift, use positions that put less stress on your back: ? [place] your knees. ? Keep the load close to your body. ? Avoid twisting.  Exercise regularly as told by your health care provider. Exercising helps your back heal faster and helps prevent back injuries by keeping muscles strong and flexible.  Work with a physical therapist to make a safe exercise program, as recommended by your health care provider. Do any exercises as told by your physical therapist. Lifestyle  Maintain a healthy weight. Extra weight puts stress on your back and makes it difficult to have good posture.  Avoid activities or situations that make  you feel anxious or stressed. Stress and anxiety increase muscle tension and can make back pain worse. Learn ways to manage anxiety and stress, such as through exercise. General instructions  Sleep on a firm mattress in a comfortable position. Try lying on your side with your knees slightly bent. If you lie on your back, put a pillow under your knees.  Follow your treatment plan as told by your health care provider. This may include: ? Cognitive or behavioral therapy. ? Acupuncture or massage therapy. ? Meditation or yoga. Contact a health care provider if:  You have pain that  is not relieved with rest or medicine.  You have increasing pain going down into your legs or buttocks.  Your pain does not improve after 2 weeks.  You have pain at night.  You lose weight without trying.  You have a fever or chills. Get help right away if:  You develop new bowel or bladder control problems.  You have unusual weakness or numbness in your arms or legs.  You develop nausea or vomiting.  You develop abdominal pain.  You feel faint. Summary  Acute back pain is sudden and usually short-lived.  Use proper lifting techniques. When you bend and lift, use positions that put less stress on your back.  Take over-the-counter and prescription medicines and apply heat or ice as directed by your health care provider. This information is not intended to replace advice given to you by your health care provider. Make sure you discuss any questions you have with your health care provider. Document Revised: 02/13/2019 Document Reviewed: 06/08/2017 Elsevier Patient Education  2020 [employer].

## 2019-11-14 ENCOUNTER — Ambulatory Visit: Payer: 59 | Admitting: Internal Medicine

## 2019-11-22 ENCOUNTER — Encounter: Payer: Self-pay | Admitting: Internal Medicine

## 2019-11-22 NOTE — Progress Notes (Signed)
This visit occurred during the SARS-CoV-2 public health emergency.  Safety protocols were in place, including screening questions prior to the visit, additional usage of staff PPE, and extensive cleaning of exam room while observing appropriate contact time as indicated for disinfecting solutions.  Subjective:     Patient ID: Jennifer Mercer , female    DOB: 19-May-1961 , 59 y.o.   MRN: [id number]   Chief Complaint  Patient presents with  . hip pain    pain radiating to right side of pt's back    HPI  She is here today for further evaluation of right hip/side pain. She denies fall/trauma. She reports that one week ago, she drove to [place] with her husband. On their way back, they stopped in [place] at a fabric store. She reports she had a sharp pain in her hip when she tried to walk. She is not sure what may have triggered her discomfort. Her husband had to help her to the car. He then drove them home and he had to lift her legs out of the car because she was unable to do so. Her sx have started to subside. She took OTC meds w/ some relief in her sx.     Past Medical History:  Diagnosis Date  . Atrophic vaginitis   . Hyperlipidemia   . Hypertension      Family History  Problem Relation Age of Onset  . Cancer Mother      Current Outpatient Medications:  .  Ascorbic Acid (VITAMIN C) 1000 MG tablet, Take 1,000 mg by mouth daily., Disp: , Rfl:  .  atorvastatin (LIPITOR) 40 MG tablet, Take 1 tablet by mouth once daily, Disp: 90 tablet, Rfl: 0 .  ELDERBERRY PO, Take by mouth., Disp: , Rfl:  .  losartan (COZAAR) 50 MG tablet, Take 1 tablet (50 mg total) by mouth daily., Disp: 90 tablet, Rfl: 2 .  metoprolol succinate (TOPROL-XL) 25 MG 24 hr tablet, Take 1 tablet by mouth once daily, Disp: 30 tablet, Rfl: 0 .  NON FORMULARY, Women's ultra mega energy & metabolism, Disp: , Rfl:  .  Zinc Sulfate (ZINC 15 PO), Take by mouth., Disp: , Rfl:  .  ECHINACEA PO, Take by mouth., Disp: , Rfl:     No Known Allergies   Review of Systems  Constitutional: Negative.   Respiratory: Negative.   Cardiovascular: Negative.   Gastrointestinal: Negative.   Musculoskeletal: Positive for arthralgias.  Neurological: Negative.   Psychiatric/Behavioral: Negative.      Today's Vitals   11/13/19 1555  BP: (!) 142/80  Pulse: (!) 109  Temp: 98.3 F (36.8 C)  TempSrc: Oral  Weight: 194 lb 3.2 oz (88.1 kg)  Height: 5\' 7"  (1.702 m)  PainSc: 4   PainLoc: Back   Body mass index is 30.42 kg/m.   Objective:  Physical Exam Vitals and nursing note reviewed.  Constitutional:      Appearance: Normal appearance.  HENT:     Head: Normocephalic and atraumatic.  Cardiovascular:     Rate and Rhythm: Normal rate and regular rhythm.     Heart sounds: Normal heart sounds.  Pulmonary:     Effort: Pulmonary effort is normal.     Breath sounds: Normal breath sounds.  Musculoskeletal:     Right hip: Tenderness present. Normal strength.  Skin:    General: Skin is warm.  Neurological:     General: No focal deficit present.     Mental Status: She is alert.  Psychiatric:  Mood and Affect: Mood normal.        Behavior: Behavior normal.         Assessment And Plan:     1. Right hip pain  Resolving. She will take Tylenol as needed. She is advised to apply Voltaren gel to affected area 2-3x/day as needed. If persistent, I will refer her to Ortho for further evaluation.   2. Acute right-sided low back pain without sciatica  She will benefit from magnesium supplementation 400mg  nightly. She does not wish to have muscle relaxer at this time. She is encouraged to perform stretching exercises. She may also consider chiropractic evaluation and may also benefit from acupuncture therapy. She will let me know if her sx persist.   3. Essential hypertension, benign  Chronic. BP likely elevated due to her discomfort. She will continue with current meds. Refill for losartan will be sent to the  pharmacy.   , MD    THE PATIENT IS ENCOURAGED TO PRACTICE SOCIAL DISTANCING DUE TO THE COVID-19 PANDEMIC.

## 2019-12-10 ENCOUNTER — Other Ambulatory Visit: Payer: Self-pay | Admitting: Internal Medicine

## 2019-12-10 DIAGNOSIS — Z1231 Encounter for screening mammogram for malignant neoplasm of breast: Secondary | ICD-10-CM

## 2019-12-11 ENCOUNTER — Ambulatory Visit: Payer: Managed Care, Other (non HMO) | Admitting: Internal Medicine

## 2019-12-11 ENCOUNTER — Encounter: Payer: Self-pay | Admitting: Internal Medicine

## 2019-12-11 ENCOUNTER — Other Ambulatory Visit: Payer: Self-pay

## 2019-12-11 VITALS — BP 136/84 | HR 74 | Temp 98.7°F | Ht 67.0 in | Wt 190.6 lb

## 2019-12-11 DIAGNOSIS — I10 Essential (primary) hypertension: Secondary | ICD-10-CM | POA: Diagnosis not present

## 2019-12-11 DIAGNOSIS — E663 Overweight: Secondary | ICD-10-CM

## 2019-12-11 DIAGNOSIS — R7309 Other abnormal glucose: Secondary | ICD-10-CM | POA: Diagnosis not present

## 2019-12-11 DIAGNOSIS — Z6829 Body mass index (BMI) 29.0-29.9, adult: Secondary | ICD-10-CM

## 2019-12-11 NOTE — Progress Notes (Signed)
This visit occurred during the SARS-CoV-2 public health emergency.  Safety protocols were in place, including screening questions prior to the visit, additional usage of staff PPE, and extensive cleaning of exam room while observing appropriate contact time as indicated for disinfecting solutions.  Subjective:     Patient ID: Jennifer Mercer , female    DOB: 10/06/1961 , 59 y.o.   MRN: [id number]   Chief Complaint  Patient presents with  . Hypertension    pre diabetes    HPI  She is here today for BP check. She feels well. She reports compliance with meds. She reports she has incorporated some exercise into her routine. She has also been more active around her home.  She adds that she has started intermittent fasting as well.   Hypertension This is a chronic problem. The current episode started more than 1 year ago. The problem has been gradually improving since onset. The problem is uncontrolled. Pertinent negatives include no blurred vision, chest pain, palpitations or shortness of breath. Risk factors for coronary artery disease include dyslipidemia and post-menopausal state. Past treatments include angiotensin blockers.     Past Medical History:  Diagnosis Date  . Atrophic vaginitis   . Hyperlipidemia   . Hypertension      Family History  Problem Relation Age of Onset  . Cancer Mother      Current Outpatient Medications:  .  Ascorbic Acid (VITAMIN C) 1000 MG tablet, Take 1,000 mg by mouth daily., Disp: , Rfl:  .  atorvastatin (LIPITOR) 40 MG tablet, Take 1 tablet by mouth once daily, Disp: 90 tablet, Rfl: 0 .  ECHINACEA PO, Take by mouth., Disp: , Rfl:  .  ELDERBERRY PO, Take by mouth., Disp: , Rfl:  .  losartan (COZAAR) 50 MG tablet, Take 1 tablet (50 mg total) by mouth daily., Disp: 90 tablet, Rfl: 2 .  metoprolol succinate (TOPROL-XL) 25 MG 24 hr tablet, Take 1 tablet by mouth once daily, Disp: 30 tablet, Rfl: 0 .  NON FORMULARY, Women's ultra mega energy &  metabolism, Disp: , Rfl:  .  Zinc Sulfate (ZINC 15 PO), Take by mouth., Disp: , Rfl:    No Known Allergies   Review of Systems  Constitutional: Negative.   Eyes: Negative for blurred vision.  Respiratory: Negative.  Negative for shortness of breath.   Cardiovascular: Negative.  Negative for chest pain and palpitations.  Gastrointestinal: Negative.   Neurological: Negative.   Psychiatric/Behavioral: Negative.      Today's Vitals   12/11/19 1013  BP: 136/84  Pulse: 74  Temp: 98.7 F (37.1 C)  TempSrc: Oral  Weight: 190 lb 9.6 oz (86.5 kg)  Height: 5\' 7"  (1.702 m)   Body mass index is 29.85 kg/m.   Objective:  Physical Exam Vitals and nursing note reviewed.  Constitutional:      Appearance: Normal appearance.  HENT:     Head: Normocephalic and atraumatic.  Cardiovascular:     Rate and Rhythm: Normal rate and regular rhythm.     Heart sounds: Normal heart sounds.  Pulmonary:     Effort: Pulmonary effort is normal.     Breath sounds: Normal breath sounds.  Skin:    General: Skin is warm.  Neurological:     General: No focal deficit present.     Mental Status: She is alert.  Psychiatric:        Mood and Affect: Mood normal.        Behavior: Behavior normal.  Assessment And Plan:     1. Essential hypertension, benign  Chronic, improved control. She is aware that our goal BP is less than 130/80. She was congratulated on her lifestyle changes and encouraged to keep up the great work. She will rto in four months for re-evaluation.   2. Other abnormal glucose  HER A1C HAS BEEN ELEVATED IN THE PAST. I WILL CHECK AN A1C, BMET TODAY. SHE WAS ENCOURAGED TO AVOID SUGARY BEVERAGES AND PROCESSED FOODS INCLUDNG BREADS, RICE AND PASTA.  - Hemoglobin A1c  3. Overweight with body mass index (BMI) of 29 to 29.9 in adult  Wt Readings from Last 3 Encounters:  12/11/19 190 lb 9.6 oz (86.5 kg)  11/13/19 194 lb 3.2 oz (88.1 kg)  09/10/19 191 lb 6.4 oz (86.8 kg)    She was congratulated on her 4 pound weight loss and encouraged to keep up the great work! Our initial goal is for her to reach BMI less than 26 to decrease cardiac risk.   Gwynneth Aliment, MD    THE PATIENT IS ENCOURAGED TO PRACTICE SOCIAL DISTANCING DUE TO THE COVID-19 PANDEMIC.

## 2019-12-12 LAB — HEMOGLOBIN A1C
Est. average glucose Bld gHb Est-mCnc: 131 mg/dL
Hgb A1c MFr Bld: 6.2 % — ABNORMAL HIGH (ref 4.8–5.6)

## 2019-12-24 ENCOUNTER — Other Ambulatory Visit: Payer: Self-pay | Admitting: Internal Medicine

## 2020-01-23 ENCOUNTER — Other Ambulatory Visit: Payer: Self-pay | Admitting: Internal Medicine

## 2020-02-08 ENCOUNTER — Other Ambulatory Visit: Payer: Self-pay

## 2020-02-08 ENCOUNTER — Ambulatory Visit
Admission: RE | Admit: 2020-02-08 | Discharge: 2020-02-08 | Disposition: A | Discharge status: Home / Self Care | Payer: 59 | Source: Ambulatory Visit | Attending: Internal Medicine | Admitting: Internal Medicine

## 2020-02-08 DIAGNOSIS — Z1231 Encounter for screening mammogram for malignant neoplasm of breast: Secondary | ICD-10-CM

## 2020-02-24 ENCOUNTER — Other Ambulatory Visit: Payer: Self-pay | Admitting: Internal Medicine

## 2020-04-14 ENCOUNTER — Other Ambulatory Visit: Payer: Self-pay

## 2020-04-14 ENCOUNTER — Encounter: Payer: Self-pay | Admitting: Internal Medicine

## 2020-04-14 ENCOUNTER — Ambulatory Visit: Payer: 59 | Admitting: Internal Medicine

## 2020-04-14 VITALS — BP 140/88 | HR 105 | Temp 98.2°F | Ht 67.0 in | Wt 191.2 lb

## 2020-04-14 DIAGNOSIS — R7309 Other abnormal glucose: Secondary | ICD-10-CM

## 2020-04-14 DIAGNOSIS — E663 Overweight: Secondary | ICD-10-CM

## 2020-04-14 DIAGNOSIS — I10 Essential (primary) hypertension: Secondary | ICD-10-CM | POA: Diagnosis not present

## 2020-04-14 DIAGNOSIS — R000 Tachycardia, unspecified: Secondary | ICD-10-CM

## 2020-04-14 NOTE — Patient Instructions (Signed)

## 2020-04-15 LAB — HEMOGLOBIN A1C
Est. average glucose Bld gHb Est-mCnc: 131 mg/dL
Hgb A1c MFr Bld: 6.2 % — ABNORMAL HIGH (ref 4.8–5.6)

## 2020-04-15 LAB — BMP8+EGFR
BUN/Creatinine Ratio: 17 (ref 9–23)
BUN: 13 mg/dL (ref 6–24)
CO2: 20 mmol/L (ref 20–29)
Calcium: 10.3 mg/dL — ABNORMAL HIGH (ref 8.7–10.2)
Chloride: 102 mmol/L (ref 96–106)
Creatinine, Ser: 0.76 mg/dL (ref 0.57–1.00)
GFR calc Af Amer: 99 mL/min/{1.73_m2} (ref >59)
GFR calc non Af Amer: 86 mL/min/{1.73_m2} (ref >59)
Glucose: 82 mg/dL (ref 65–99)
Potassium: 3.9 mmol/L (ref 3.5–5.2)
Sodium: 141 mmol/L (ref 134–144)

## 2020-04-17 ENCOUNTER — Encounter: Payer: Self-pay | Admitting: Internal Medicine

## 2020-04-17 ENCOUNTER — Other Ambulatory Visit: Payer: Self-pay | Admitting: Internal Medicine

## 2020-04-17 DIAGNOSIS — E7800 Pure hypercholesterolemia, unspecified: Secondary | ICD-10-CM

## 2020-04-17 DIAGNOSIS — I10 Essential (primary) hypertension: Secondary | ICD-10-CM

## 2020-04-17 DIAGNOSIS — E663 Overweight: Secondary | ICD-10-CM

## 2020-04-18 NOTE — Progress Notes (Signed)
This visit occurred during the SARS-CoV-2 public health emergency.  Safety protocols were in place, including screening questions prior to the visit, additional usage of staff PPE, and extensive cleaning of exam room while observing appropriate contact time as indicated for disinfecting solutions.  Subjective:     Patient ID: Jennifer Mercer , female    DOB: [birth year] , 59 y.o.   MRN: [id number]   Chief Complaint  Patient presents with  . Hypertension    HPI  She is here today for f/u htn. She reports compliance with meds. She is still following intermittent fasting meal plan. She is disappointed to see she has yet to have significant weight loss.   Hypertension This is a chronic problem. The current episode started more than 1 year ago. The problem has been gradually improving since onset. The problem is controlled. Pertinent negatives include no blurred vision, chest pain, palpitations or shortness of breath. Past treatments include beta blockers and angiotensin blockers. The current treatment provides moderate improvement.     Past Medical History:  Diagnosis Date  . Atrophic vaginitis   . Hyperlipidemia   . Hypertension      Family History  Problem Relation Age of Onset  . Cancer Mother      Current Outpatient Medications:  .  Ascorbic Acid (VITAMIN C) 1000 MG tablet, Take 1,000 mg by mouth daily., Disp: , Rfl:  .  atorvastatin (LIPITOR) 40 MG tablet, Take 1 tablet by mouth once daily, Disp: 90 tablet, Rfl: 1 .  ECHINACEA PO, Take by mouth., Disp: , Rfl:  .  ELDERBERRY PO, Take by mouth., Disp: , Rfl:  .  losartan (COZAAR) 50 MG tablet, TAKE 1 TABLET BY MOUTH ONCE DAILY . APPOINTMENT REQUIRED FOR FUTURE REFILLS, Disp: 90 tablet, Rfl: 0 .  metoprolol succinate (TOPROL-XL) 25 MG 24 hr tablet, Take 1 tablet by mouth once daily, Disp: 30 tablet, Rfl: 0 .  NON FORMULARY, Women's ultra mega energy & metabolism, Disp: , Rfl:  .  Zinc Sulfate (ZINC 15 PO), Take by mouth., Disp:  , Rfl:    No Known Allergies   Review of Systems  Constitutional: Negative.   Eyes: Negative for blurred vision.  Respiratory: Negative.  Negative for shortness of breath.   Cardiovascular: Negative.  Negative for chest pain and palpitations.  Gastrointestinal: Negative.   Neurological: Negative.   Psychiatric/Behavioral: Negative.      Today's Vitals   04/14/20 1616  BP: 140/88  Pulse: (!) 105  Temp: 98.2 F (36.8 C)  TempSrc: Oral  Weight: 191 lb 3.2 oz (86.7 kg)  Height: 5' 7" (1.702 m)   Body mass index is 29.95 kg/m.   Objective:  Physical Exam Vitals and nursing note reviewed.  Constitutional:      Appearance: Normal appearance.  HENT:     Head: Normocephalic and atraumatic.  Cardiovascular:     Rate and Rhythm: Regular rhythm. Tachycardia present.     Heart sounds: Normal heart sounds.  Pulmonary:     Effort: Pulmonary effort is normal.     Breath sounds: Normal breath sounds.  Skin:    General: Skin is warm.  Neurological:     General: No focal deficit present.     Mental Status: She is alert.  Psychiatric:        Mood and Affect: Mood normal.        Behavior: Behavior normal.         Assessment And Plan:     1. Essential hypertension,  benign  Chronic, I will check renal function today. BP uncontrolled today. She will continue with ucrrent meds for now. She is encouraged to avoid adding salt to her foods and to incorporate more exercise into her daily routine.   - BMP8+EGFR  2. Tachycardia  HR 105 today. She is advised to limit her caffeine intake, and to avoid caffeine after 2pm. Importance of adequate hydration was also discussed with the patient.   3. Other abnormal glucose  HER A1C HAS BEEN ELEVATED IN THE PAST. I WILL CHECK AN A1C, BMET TODAY. SHE WAS ENCOURAGED TO AVOID SUGARY BEVERAGES AND PROCESSED FOODS INCLUDNG BREADS, RICE AND PASTA.  - Hemoglobin A1c  4. Overweight (BMI 25.0-29.9)  Her weight is stable with BMI 29. She may  benefit from weight mgmt clinic. Again, importance of regular exercise was discussed with the patient.   Maximino Greenland, MD    THE PATIENT IS ENCOURAGED TO PRACTICE SOCIAL DISTANCING DUE TO THE COVID-19 PANDEMIC.

## 2020-06-30 ENCOUNTER — Other Ambulatory Visit: Payer: Self-pay | Admitting: Internal Medicine

## 2020-07-14 ENCOUNTER — Other Ambulatory Visit: Payer: Self-pay | Admitting: Internal Medicine

## 2020-09-02 ENCOUNTER — Encounter: Payer: Self-pay | Admitting: Internal Medicine

## 2020-09-02 ENCOUNTER — Ambulatory Visit: Payer: 59 | Admitting: Internal Medicine

## 2020-09-02 ENCOUNTER — Other Ambulatory Visit: Payer: Self-pay

## 2020-09-02 VITALS — BP 132/70 | HR 73 | Temp 98.8°F | Ht 67.0 in | Wt 191.4 lb

## 2020-09-02 DIAGNOSIS — E663 Overweight: Secondary | ICD-10-CM

## 2020-09-02 DIAGNOSIS — Z0000 Encounter for general adult medical examination without abnormal findings: Secondary | ICD-10-CM | POA: Diagnosis not present

## 2020-09-02 DIAGNOSIS — Z23 Encounter for immunization: Secondary | ICD-10-CM | POA: Diagnosis not present

## 2020-09-02 DIAGNOSIS — Z6829 Body mass index (BMI) 29.0-29.9, adult: Secondary | ICD-10-CM | POA: Diagnosis not present

## 2020-09-02 DIAGNOSIS — E559 Vitamin D deficiency, unspecified: Secondary | ICD-10-CM | POA: Diagnosis not present

## 2020-09-02 DIAGNOSIS — I10 Essential (primary) hypertension: Secondary | ICD-10-CM

## 2020-09-02 LAB — POCT URINALYSIS DIPSTICK
Bilirubin, UA: NEGATIVE
Blood, UA: NEGATIVE
Glucose, UA: NEGATIVE
Ketones, UA: NEGATIVE
Leukocytes, UA: NEGATIVE
Nitrite, UA: NEGATIVE
Protein, UA: NEGATIVE
Spec Grav, UA: 1.02 (ref 1.010–1.025)
Urobilinogen, UA: 0.2 E.U./dL
pH, UA: 7 (ref 5.0–8.0)

## 2020-09-02 LAB — POCT UA - MICROALBUMIN
Albumin/Creatinine Ratio, Urine, POC: <30
Creatinine, POC: 200 mg/dL
Microalbumin Ur, POC: 30 mg/L

## 2020-09-02 NOTE — Patient Instructions (Addendum)
[employer] tour, [employer], [employer], [employer] (brunch) Ord[employer] (), [facility]?   Health Maintenance, Female Adopting a healthy lifestyle and getting preventive care are important in promoting health and wellness. Ask your health care provider about:  The right schedule for you to have regular tests and exams.  Things you can do on your own to prevent diseases and keep yourself healthy. What should I know about diet, weight, and exercise? Eat a healthy diet   Eat a diet that includes plenty of vegetables, fruits, low-fat dairy products, and lean protein.  Do not eat a lot of foods that are high in solid fats, added sugars, or sodium. Maintain a healthy weight Body mass index (BMI) is used to identify weight problems. It estimates body fat based on height and weight. Your health care provider can help determine your BMI and help you achieve or maintain a healthy weight. Get regular exercise Get regular exercise. This is one of the most important things you can do for your health. Most adults should:  Exercise for at least 150 minutes each week. The exercise should increase your heart rate and make you sweat (moderate-intensity exercise).  Do strengthening exercises at least twice a week. This is in addition to the moderate-intensity exercise.  Spend less time sitting. Even light physical activity can be beneficial. Watch cholesterol and blood lipids Have your blood tested for lipids and cholesterol at 60 years of age, then have this test every 5 years. Have your cholesterol levels checked more often if:  Your lipid or cholesterol levels are high.  You are older than 60 years of age.  You are at high risk for heart disease. What should I know about cancer screening? Depending on your health history and family history, you may need to have cancer screening at various ages. This may include screening for:  Breast cancer.  Cervical  cancer.  Colorectal cancer.  Skin cancer.  Lung cancer. What should I know about heart disease, diabetes, and high blood pressure? Blood pressure and heart disease  High blood pressure causes heart disease and increases the risk of stroke. This is more likely to develop in people who have high blood pressure readings, are of African descent, or are overweight.  Have your blood pressure checked: ? Every 3-5 years if you are 7-77 years of age. ? Every year if you are 62 years old or older. Diabetes Have regular diabetes screenings. This checks your fasting blood sugar level. Have the screening done:  Once every three years after age 48 if you are at a normal weight and have a low risk for diabetes.  More often and at a younger age if you are overweight or have a high risk for diabetes. What should I know about preventing infection? Hepatitis B If you have a higher risk for hepatitis B, you should be screened for this virus. Talk with your health care provider to find out if you are at risk for hepatitis B infection. Hepatitis C Testing is recommended for:  Everyone born from [birth year] through 1966.  Anyone with known risk factors for hepatitis C. Sexually transmitted infections (STIs)  Get screened for STIs, including gonorrhea and chlamydia, if: ? You are sexually active and are younger than 60 years of age. ? You are older than 60 years of age and your health care provider tells you that you are at risk for this type of infection. ? Your sexual activity has changed since you were  last screened, and you are at increased risk for chlamydia or gonorrhea. Ask your health care provider if you are at risk.  Ask your health care provider about whether you are at high risk for HIV. Your health care provider may recommend a prescription medicine to help prevent HIV infection. If you choose to take medicine to prevent HIV, you should first get tested for HIV. You should then be tested every 3  months for as long as you are taking the medicine. Pregnancy  If you are about to stop having your period (premenopausal) and you may become pregnant, seek counseling before you get pregnant.  Take 400 to 800 micrograms (mcg) of folic acid every day if you become pregnant.  Ask for birth control (contraception) if you want to prevent pregnancy. Osteoporosis and menopause Osteoporosis is a disease in which the bones lose minerals and strength with aging. This can result in bone fractures. If you are 51 years old or older, or if you are at risk for osteoporosis and fractures, ask your health care provider if you should:  Be screened for bone loss.  Take a calcium or vitamin D supplement to lower your risk of fractures.  Be given hormone replacement therapy (HRT) to treat symptoms of menopause. Follow these instructions at home: Lifestyle  Do not use any products that contain nicotine or tobacco, such as cigarettes, e-cigarettes, and chewing tobacco. If you need help quitting, ask your health care provider.  Do not use street drugs.  Do not share needles.  Ask your health care provider for help if you need support or information about quitting drugs. Alcohol use  Do not drink alcohol if: ? Your health care provider tells you not to drink. ? You are pregnant, may be pregnant, or are planning to become pregnant.  If you drink alcohol: ? Limit how much you use to 0-1 drink a day. ? Limit intake if you are breastfeeding.  Be aware of how much alcohol is in your drink. In the [place] one drink equals one 12 oz bottle of beer (355 mL), one 5 oz glass of wine (148 mL), or one 1 oz glass of hard liquor (44 mL). General instructions  Schedule regular health, dental, and eye exams.  Stay current with your vaccines.  Tell your health care provider if: ? You often feel depressed. ? You have ever been abused or do not feel safe at home. Summary  Adopting a healthy lifestyle and getting  preventive care are important in promoting health and wellness.  Follow your health care provider's instructions about healthy diet, exercising, and getting tested or screened for diseases.  Follow your health care provider's instructions on monitoring your cholesterol and blood pressure. This information is not intended to replace advice given to you by your health care provider. Make sure you discuss any questions you have with your health care provider. Document Revised: 10/18/2018 Document Reviewed: 10/18/2018 Elsevier Patient Education  2020 [employer].   Echocardiogram An echocardiogram is a procedure that uses painless sound waves (ultrasound) to produce an image of the heart. Images from an echocardiogram can provide important information about:  Signs of coronary artery disease (CAD).  Aneurysm detection. An aneurysm is a weak or damaged part of an artery wall that bulges out from the normal force of blood pumping through the body.  Heart size and shape. Changes in the size or shape of the heart can be associated with certain conditions, including heart failure, aneurysm, and CAD.  Heart muscle function.  Heart valve function.  Signs of a past heart attack.  Fluid buildup around the heart.  Thickening of the heart muscle.  A tumor or infectious growth around the heart valves. Tell a health care provider about:  Any allergies you have.  All medicines you are taking, including vitamins, herbs, eye drops, creams, and over-the-counter medicines.  Any blood disorders you have.  Any surgeries you have had.  Any medical conditions you have.  Whether you are pregnant or may be pregnant. What are the risks? Generally, this is a safe procedure. However, problems may occur, including:  Allergic reaction to dye (contrast) that may be used during the procedure. What happens before the procedure? No specific preparation is needed. You may eat and drink normally. What  happens during the procedure?   An IV tube may be inserted into one of your veins.  You may receive contrast through this tube. A contrast is an injection that improves the quality of the pictures from your heart.  A gel will be applied to your chest.  A wand-like tool (transducer) will be moved over your chest. The gel will help to transmit the sound waves from the transducer.  The sound waves will harmlessly bounce off of your heart to allow the heart images to be captured in real-time motion. The images will be recorded on a computer. The procedure may vary among health care providers and hospitals. What happens after the procedure?  You may return to your normal, everyday life, including diet, activities, and medicines, unless your health care provider tells you not to do that. Summary  An echocardiogram is a procedure that uses painless sound waves (ultrasound) to produce an image of the heart.  Images from an echocardiogram can provide important information about the size and shape of your heart, heart muscle function, heart valve function, and fluid buildup around your heart.  You do not need to do anything to prepare before this procedure. You may eat and drink normally.  After the echocardiogram is completed, you may return to your normal, everyday life, unless your health care provider tells you not to do that. This information is not intended to replace advice given to you by your health care provider. Make sure you discuss any questions you have with your health care provider. Document Revised: 02/15/2019 Document Reviewed: 11/27/2016 Elsevier Patient Education  2020 [employer].

## 2020-09-02 NOTE — Progress Notes (Signed)
I,Tianna Badgett,acting as a Education administrator for Maximino Greenland, MD.,have documented all relevant documentation on the behalf of Maximino Greenland, MD,as directed by  Maximino Greenland, MD while in the presence of Maximino Greenland, MD.  This visit occurred during the SARS-CoV-2 public health emergency.  Safety protocols were in place, including screening questions prior to the visit, additional usage of staff PPE, and extensive cleaning of exam room while observing appropriate contact time as indicated for disinfecting solutions.  Subjective:     Patient ID: Jennifer Mercer , female    DOB: [birth year] , 59 y.o.   MRN: [id number]   Chief Complaint  Patient presents with  . Annual Exam  . Hypertension    HPI  Patient is here today for full physical. She reports compliance with medications. She currently does not have a GYN. She thinks that her last PAP smear was in 2018. She does not wish to have pelvic exam today. She reports she has been going to [facility] for her weight management. She is frustrated with her lack of progress. She is happy to report that she is now exercising on a regular basis.   Hypertension This is a chronic problem. The current episode started more than 1 year ago. The problem has been gradually improving since onset. The problem is uncontrolled. Pertinent negatives include no blurred vision, chest pain or shortness of breath. Risk factors for coronary artery disease include dyslipidemia and post-menopausal state. Past treatments include angiotensin blockers. There are no compliance problems.      Past Medical History:  Diagnosis Date  . Atrophic vaginitis   . Hyperlipidemia   . Hypertension      Family History  Problem Relation Age of Onset  . Cancer Mother      Current Outpatient Medications:  .  Ascorbic Acid (VITAMIN C) 1000 MG tablet, Take 1,000 mg by mouth daily., Disp: , Rfl:  .  atorvastatin (LIPITOR) 40 MG tablet, Take 1 tablet by mouth once daily, Disp: 90  tablet, Rfl: 0 .  ECHINACEA PO, Take by mouth., Disp: , Rfl:  .  ELDERBERRY PO, Take by mouth., Disp: , Rfl:  .  losartan (COZAAR) 50 MG tablet, TAKE 1 TABLET BY MOUTH ONCE DAILY . APPOINTMENT REQUIRED FOR FUTURE REFILLS, Disp: 90 tablet, Rfl: 0 .  metoprolol succinate (TOPROL-XL) 25 MG 24 hr tablet, Take 1 tablet by mouth once daily, Disp: 90 tablet, Rfl: 0 .  NON FORMULARY, Women's ultra mega energy & metabolism, Disp: , Rfl:  .  Zinc Sulfate (ZINC 15 PO), Take by mouth., Disp: , Rfl:    No Known Allergies    The patient states she uses none for birth control. Last LMP was No LMP recorded. Patient is postmenopausal.. Negative for Dysmenorrhea. Negative for: breast discharge, breast lump(s), breast pain and breast self exam. Associated symptoms include abnormal vaginal bleeding. Pertinent negatives include abnormal bleeding (hematology), anxiety, decreased libido, depression, difficulty falling sleep, dyspareunia, history of infertility, nocturia, sexual dysfunction, sleep disturbances, urinary incontinence, urinary urgency, vaginal discharge and vaginal itching. Diet regular.The patient states her exercise level is  moderate.   . The patient's tobacco use is:  Social History   Tobacco Use  Smoking Status Never Smoker  Smokeless Tobacco Never Used  . She has been exposed to passive smoke. The patient's alcohol use is:  Social History   Substance and Sexual Activity  Alcohol Use No    Review of Systems  Constitutional: Negative.   HENT: Negative.  Eyes: Negative.  Negative for blurred vision.  Respiratory: Negative.  Negative for shortness of breath.   Cardiovascular: Negative.  Negative for chest pain.  Gastrointestinal: Negative.   Endocrine: Negative.   Genitourinary: Negative.   Musculoskeletal: Negative.   Skin: Negative.   Allergic/Immunologic: Negative.   Neurological: Negative.   Hematological: Negative.   Psychiatric/Behavioral: Negative.      Today's Vitals    09/02/20 1410  BP: 132/70  Pulse: 73  Temp: 98.8 F (37.1 C)  TempSrc: Oral  Weight: 191 lb 6.4 oz (86.8 kg)  Height: 5' 7"  (1.702 m)   Body mass index is 29.98 kg/m.  Wt Readings from Last 3 Encounters:  09/02/20 191 lb 6.4 oz (86.8 kg)  04/14/20 191 lb 3.2 oz (86.7 kg)  12/11/19 190 lb 9.6 oz (86.5 kg)     Objective:  Physical Exam Vitals and nursing note reviewed.  Constitutional:      Appearance: Normal appearance.  HENT:     Head: Normocephalic and atraumatic.     Right Ear: Tympanic membrane, ear canal and external ear normal.     Left Ear: Tympanic membrane, ear canal and external ear normal.     Nose:     Comments: Deferred, masked    Mouth/Throat:     Comments: Deferred, masked Eyes:     Extraocular Movements: Extraocular movements intact.     Conjunctiva/sclera: Conjunctivae normal.     Pupils: Pupils are equal, round, and reactive to light.  Cardiovascular:     Rate and Rhythm: Normal rate and regular rhythm.     Pulses: Normal pulses.          Dorsalis pedis pulses are 2+ on the right side and 2+ on the left side.     Heart sounds: Normal heart sounds.  Pulmonary:     Effort: Pulmonary effort is normal.     Breath sounds: Normal breath sounds.  Chest:     Breasts: Tanner Score is 5.        Right: Normal.        Left: Normal.  Abdominal:     General: Abdomen is flat. Bowel sounds are normal.     Palpations: Abdomen is soft.  Genitourinary:    Comments: deferred Musculoskeletal:        General: Normal range of motion.     Cervical back: Normal range of motion and neck supple.  Feet:     Right foot:     Toenail Condition: Right toenails are normal.     Left foot:     Toenail Condition: Left toenails are normal.  Skin:    General: Skin is warm and dry.  Neurological:     General: No focal deficit present.     Mental Status: She is alert and oriented to person, place, and time.  Psychiatric:        Mood and Affect: Mood normal.         Behavior: Behavior normal.         Assessment And Plan:     1. Routine general medical examination at health care facility Comments: A full exam was performed. Importance of monthly self breast exams was discussed with the patient.She declined pelvic exam. I will refer her to GYN once she has done some "research" on providers in the area. PATIENT IS ADVISED TO GET 30-45 MINUTES REGULAR EXERCISE NO LESS THAN FOUR TO FIVE DAYS PER WEEK - BOTH WEIGHTBEARING EXERCISES AND AEROBIC ARE RECOMMENDED.  PATIENT IS ADVISED  TO FOLLOW A HEALTHY DIET WITH AT LEAST SIX FRUITS/VEGGIES PER DAY, DECREASE INTAKE OF RED MEAT, AND TO INCREASE FISH INTAKE TO TWO DAYS PER WEEK.  MEATS/FISH SHOULD NOT BE FRIED, BAKED OR BROILED IS PREFERABLE.  I SUGGEST WEARING SPF 50 SUNSCREEN ON EXPOSED PARTS AND ESPECIALLY WHEN IN THE DIRECT SUNLIGHT FOR AN EXTENDED PERIOD OF TIME.  PLEASE AVOID FAST FOOD RESTAURANTS AND INCREASE YOUR WATER INTAKE. - CBC - Hemoglobin A1c - CMP14+EGFR - Lipid panel - Hepatitis C antibody  2. Essential hypertension, benign Comments: Chronic, controlled. She will continue with current meds. She is encouraged to avoid adding salt to her foods. EKG performed, no new changes noted. She will rto in six months for re-evaluation.  - POCT Urinalysis Dipstick (81002) - POCT UA - Microalbumin - EKG 12-Lead  3. Vitamin D deficiency, unspecified Comments: I will check vitamin D level and supplement as needed.  - VITAMIN D 25 Hydroxy (Vit-D Deficiency, Fractures)  4. Overweight with body mass index (BMI) of 29 to 29.9 in adult Comments: She is encouraged to aim for at least 150 minutes of exercise per week. She plans to continue with [facility] for 90 days to see how much she loses.   5. Need for influenza vaccination Comments: She was given flu vaccine.  - Flu Vaccine QUAD 6+ mos PF IM (Fluarix Quad PF)      Patient was given opportunity to ask questions. Patient verbalized understanding of the plan  and was able to repeat key elements of the plan. All questions were answered to their satisfaction.   Maximino Greenland, MD   I, Maximino Greenland, MD, have reviewed all documentation for this visit. The documentation on 09/02/20 for the exam, diagnosis, procedures, and orders are all accurate and complete.  THE PATIENT IS ENCOURAGED TO PRACTICE SOCIAL DISTANCING DUE TO THE COVID-19 PANDEMIC.

## 2020-09-03 LAB — CMP14+EGFR
ALT: 25 IU/L (ref 0–32)
AST: 21 IU/L (ref 0–40)
Albumin/Globulin Ratio: 2 (ref 1.2–2.2)
Albumin: 5.1 g/dL — ABNORMAL HIGH (ref 3.8–4.9)
Alkaline Phosphatase: 97 IU/L (ref 44–121)
BUN/Creatinine Ratio: 22 (ref 12–28)
BUN: 17 mg/dL (ref 8–27)
Bilirubin Total: 1 mg/dL (ref 0.0–1.2)
CO2: 23 mmol/L (ref 20–29)
Calcium: 10.2 mg/dL (ref 8.7–10.3)
Chloride: 102 mmol/L (ref 96–106)
Creatinine, Ser: 0.79 mg/dL (ref 0.57–1.00)
GFR calc Af Amer: 94 mL/min/{1.73_m2} (ref >59)
GFR calc non Af Amer: 82 mL/min/{1.73_m2} (ref >59)
Globulin, Total: 2.5 g/dL (ref 1.5–4.5)
Glucose: 95 mg/dL (ref 65–99)
Potassium: 4.3 mmol/L (ref 3.5–5.2)
Sodium: 140 mmol/L (ref 134–144)
Total Protein: 7.6 g/dL (ref 6.0–8.5)

## 2020-09-03 LAB — HEPATITIS C ANTIBODY: Hep C Virus Ab: <0.1 s/co ratio (ref 0.0–0.9)

## 2020-09-03 LAB — CBC
Hematocrit: 41.6 % (ref 34.0–46.6)
Hemoglobin: 14.5 g/dL (ref 11.1–15.9)
MCH: 29.6 pg (ref 26.6–33.0)
MCHC: 34.9 g/dL (ref 31.5–35.7)
MCV: 85 fL (ref 79–97)
Platelets: 169 10*3/uL (ref 150–450)
RBC: 4.9 x10E6/uL (ref 3.77–5.28)
RDW: 12.7 % (ref 11.7–15.4)
WBC: 5.1 10*3/uL (ref 3.4–10.8)

## 2020-09-03 LAB — LIPID PANEL
Chol/HDL Ratio: 4.3 ratio (ref 0.0–4.4)
Cholesterol, Total: 214 mg/dL — ABNORMAL HIGH (ref 100–199)
HDL: 50 mg/dL (ref >39)
LDL Chol Calc (NIH): 137 mg/dL — ABNORMAL HIGH (ref 0–99)
Triglycerides: 149 mg/dL (ref 0–149)
VLDL Cholesterol Cal: 27 mg/dL (ref 5–40)

## 2020-09-03 LAB — HEMOGLOBIN A1C
Est. average glucose Bld gHb Est-mCnc: 134 mg/dL
Hgb A1c MFr Bld: 6.3 % — ABNORMAL HIGH (ref 4.8–5.6)

## 2020-09-03 LAB — VITAMIN D 25 HYDROXY (VIT D DEFICIENCY, FRACTURES): Vit D, 25-Hydroxy: 38.7 ng/mL (ref 30.0–100.0)

## 2020-09-25 ENCOUNTER — Encounter: Payer: Self-pay | Admitting: Internal Medicine

## 2020-10-03 ENCOUNTER — Other Ambulatory Visit: Payer: Self-pay | Admitting: Internal Medicine

## 2020-10-13 ENCOUNTER — Encounter: Payer: Self-pay | Admitting: Internal Medicine

## 2020-11-18 ENCOUNTER — Encounter: Payer: Self-pay | Admitting: Internal Medicine

## 2020-11-18 ENCOUNTER — Other Ambulatory Visit: Payer: Self-pay | Admitting: Internal Medicine

## 2020-11-18 MED ORDER — VALSARTAN 160 MG PO TABS: 160.0000 mg | ORAL_TABLET | Freq: Every day | ORAL | 11 refills | Status: DC

## 2020-11-20 ENCOUNTER — Encounter: Payer: Self-pay | Admitting: Internal Medicine

## 2020-12-25 ENCOUNTER — Ambulatory Visit: Payer: 59 | Admitting: Internal Medicine

## 2020-12-25 ENCOUNTER — Encounter: Payer: Self-pay | Admitting: Internal Medicine

## 2020-12-25 ENCOUNTER — Other Ambulatory Visit: Payer: Self-pay

## 2020-12-25 VITALS — BP 128/76 | HR 80 | Temp 98.8°F | Ht 67.0 in | Wt 187.2 lb

## 2020-12-25 DIAGNOSIS — R7309 Other abnormal glucose: Secondary | ICD-10-CM

## 2020-12-25 DIAGNOSIS — E7800 Pure hypercholesterolemia, unspecified: Secondary | ICD-10-CM

## 2020-12-25 DIAGNOSIS — I10 Essential (primary) hypertension: Secondary | ICD-10-CM

## 2020-12-25 MED ORDER — METOPROLOL SUCCINATE ER 25 MG PO TB24: 25.0000 mg | ORAL_TABLET | Freq: Every day | ORAL | 2 refills | Status: DC

## 2020-12-25 MED ORDER — ATORVASTATIN CALCIUM 40 MG PO TABS
40.0000 mg | ORAL_TABLET | Freq: Every day | ORAL | 2 refills | Status: DC
Start: 2020-12-25 — End: 2021-04-30

## 2020-12-25 MED ORDER — VALSARTAN 160 MG PO TABS: 160.0000 mg | ORAL_TABLET | Freq: Every day | ORAL | 2 refills | Status: DC

## 2020-12-25 NOTE — Progress Notes (Signed)
I,Katawbba Wiggins,acting as a Education administrator for Maximino Greenland, MD.,have documented all relevant documentation on the behalf of Maximino Greenland, MD,as directed by  Maximino Greenland, MD while in the presence of Maximino Greenland, MD.  This visit occurred during the SARS-CoV-2 public health emergency.  Safety protocols were in place, including screening questions prior to the visit, additional usage of staff PPE, and extensive cleaning of exam room while observing appropriate contact time as indicated for disinfecting solutions.  Subjective:     Patient ID: Jennifer Mercer , female    DOB: Sep 23, 1961 , 60 y.o.   MRN: [id number]   Chief Complaint  Patient presents with  . Hypertension    HPI  She is here today for f/u HTN. She reports compliance with meds. She reports she is meal planning and working out with a Physiological scientist. She reports she feels a lot better since making this lifestyle changes.   Hypertension This is a chronic problem. The current episode started more than 1 year ago. The problem has been gradually improving since onset. The problem is controlled. Pertinent negatives include no blurred vision, chest pain, palpitations or shortness of breath. Past treatments include beta blockers and angiotensin blockers. The current treatment provides moderate improvement.     Past Medical History:  Diagnosis Date  . Atrophic vaginitis   . Hyperlipidemia   . Hypertension      Family History  Problem Relation Age of Onset  . Cancer Mother      Current Outpatient Medications:  .  ECHINACEA PO, Take by mouth., Disp: , Rfl:  .  NON FORMULARY, Women's ultra mega energy & metabolism, Disp: , Rfl:  .  Ascorbic Acid (VITAMIN C) 1000 MG tablet, Take 1,000 mg by mouth daily. (Patient not taking: Reported on 12/25/2020), Disp: , Rfl:  .  atorvastatin (LIPITOR) 40 MG tablet, Take 1 tablet (40 mg total) by mouth daily., Disp: 90 tablet, Rfl: 2 .  ELDERBERRY PO, Take by mouth. (Patient not  taking: Reported on 12/25/2020), Disp: , Rfl:  .  metoprolol succinate (TOPROL-XL) 25 MG 24 hr tablet, Take 1 tablet (25 mg total) by mouth daily., Disp: 90 tablet, Rfl: 2 .  valsartan (DIOVAN) 160 MG tablet, Take 1 tablet (160 mg total) by mouth daily., Disp: 90 tablet, Rfl: 2 .  Zinc Sulfate (ZINC 15 PO), Take by mouth. (Patient not taking: Reported on 12/25/2020), Disp: , Rfl:    No Known Allergies   Review of Systems  Constitutional: Negative.   Eyes: Negative for blurred vision.  Respiratory: Negative.  Negative for shortness of breath.   Cardiovascular: Negative.  Negative for chest pain and palpitations.  Gastrointestinal: Negative.   Psychiatric/Behavioral: Negative.   All other systems reviewed and are negative.    Today's Vitals   12/25/20 1618  BP: 128/76  Pulse: 80  Temp: 98.8 F (37.1 C)  TempSrc: Oral  Weight: 187 lb 3.2 oz (84.9 kg)  Height: 5' 7"  (1.702 m)   Body mass index is 29.32 kg/m.   Objective:  Physical Exam Vitals and nursing note reviewed.  Constitutional:      Appearance: Normal appearance. She is obese.  HENT:     Head: Normocephalic and atraumatic.  Cardiovascular:     Rate and Rhythm: Normal rate and regular rhythm.     Heart sounds: Normal heart sounds.  Pulmonary:     Breath sounds: Normal breath sounds.  Skin:    General: Skin is warm.  Neurological:  General: No focal deficit present.     Mental Status: She is alert and oriented to person, place, and time.         Assessment And Plan:     1. Essential hypertension, benign Comments: Chronic, well controlled. HR is now normal with metoprolol on board. I will check renal function today. She will rto in 4-6 months.  - BMP8+EGFR  2. Other abnormal glucose Comments: Her hba1c has been elevated in the past. I will check BMP/a1c today. Advised to avoid sugary beverages, including juices and sodas.  - Hemoglobin A1c  3. Pure hypercholesterolemia Comments: Previous labs reviewed.  I will send refill atorvastatin to the pharmacy. Advised to follow heart healthy lifestyle to decrease cardiac risk.    Patient was given opportunity to ask questions. Patient verbalized understanding of the plan and was able to repeat key elements of the plan. All questions were answered to their satisfaction.   I, Maximino Greenland, MD, have reviewed all documentation for this visit. The documentation on 12/28/20 for the exam, diagnosis, procedures, and orders are all accurate and complete.  THE PATIENT IS ENCOURAGED TO PRACTICE SOCIAL DISTANCING DUE TO THE COVID-19 PANDEMIC.

## 2020-12-25 NOTE — Patient Instructions (Addendum)
Dr. Connye Burkitt at [place] [facility] Dr. Jaymes Graff at [facility] Colleagues: Drs. Su Hilt, [place], [place] [facility] [place], [place], [place]  Hypertension, Adult High blood pressure (hypertension) is when the force of blood pumping through the arteries is too strong. The arteries are the blood vessels that carry blood from the heart throughout the body. Hypertension forces the heart to work harder to pump blood and may cause arteries to become narrow or stiff. Untreated or uncontrolled hypertension can cause a heart attack, heart failure, a stroke, kidney disease, and other problems. A blood pressure reading consists of a higher number over a lower number. Ideally, your blood pressure should be below 120/80. The first ("top") number is called the systolic pressure. It is a measure of the pressure in your arteries as your heart beats. The second ("bottom") number is called the diastolic pressure. It is a measure of the pressure in your arteries as the heart relaxes. What are the causes? The exact cause of this condition is not known. There are some conditions that result in or are related to high blood pressure. What increases the risk? Some risk factors for high blood pressure are under your control. The following factors may make you more likely to develop this condition:  Smoking.  Having type 2 diabetes mellitus, high cholesterol, or both.  Not getting enough exercise or physical activity.  Being overweight.  Having too much fat, sugar, calories, or salt (sodium) in your diet.  Drinking too much alcohol. Some risk factors for high blood pressure may be difficult or impossible to change. Some of these factors include:  Having chronic kidney disease.  Having a family history of high blood pressure.  Age. Risk increases with age.  Race. You may be at higher risk if you are African American.  Gender. Men are at higher risk than women before age 63. After age 33, women are at higher risk  than men.  Having obstructive sleep apnea.  Stress. What are the signs or symptoms? High blood pressure may not cause symptoms. Very high blood pressure (hypertensive crisis) may cause:  Headache.  Anxiety.  Shortness of breath.  Nosebleed.  Nausea and vomiting.  Vision changes.  Severe chest pain.  Seizures. How is this diagnosed? This condition is diagnosed by measuring your blood pressure while you are seated, with your arm resting on a flat surface, your legs uncrossed, and your feet flat on the floor. The cuff of the blood pressure monitor will be placed directly against the skin of your upper arm at the level of your heart. It should be measured at least twice using the same arm. Certain conditions can cause a difference in blood pressure between your right and left arms. Certain factors can cause blood pressure readings to be lower or higher than normal for a short period of time:  When your blood pressure is higher when you are in a health care provider's office than when you are at home, this is called white coat hypertension. Most people with this condition do not need medicines.  When your blood pressure is higher at home than when you are in a health care provider's office, this is called masked hypertension. Most people with this condition may need medicines to control blood pressure. If you have a high blood pressure reading during one visit or you have normal blood pressure with other risk factors, you may be asked to:  Return on a different day to have your blood pressure checked again.  Monitor your blood pressure at home for 1 week or longer. If you are diagnosed with hypertension, you may have other blood or imaging tests to help your health care provider understand your overall risk for other conditions. How is this treated? This condition is treated by making healthy lifestyle changes, such as eating healthy foods, exercising more, and reducing your alcohol  intake. Your health care provider may prescribe medicine if lifestyle changes are not enough to get your blood pressure under control, and if:  Your systolic blood pressure is above 130.  Your diastolic blood pressure is above 80. Your personal target blood pressure may vary depending on your medical conditions, your age, and other factors. Follow these instructions at home: Eating and drinking  Eat a diet that is high in fiber and potassium, and low in sodium, added sugar, and fat. An example eating plan is called the DASH (Dietary Approaches to Stop Hypertension) diet. To eat this way: ? Eat plenty of fresh fruits and vegetables. Try to fill one half of your plate at each meal with fruits and vegetables. ? Eat whole grains, such as whole-wheat pasta, brown rice, or whole-grain bread. Fill about one fourth of your plate with whole grains. ? Eat or drink low-fat dairy products, such as skim milk or low-fat yogurt. ? Avoid fatty cuts of meat, processed or cured meats, and poultry with skin. Fill about one fourth of your plate with lean proteins, such as fish, chicken without skin, beans, eggs, or tofu. ? Avoid pre-made and processed foods. These tend to be higher in sodium, added sugar, and fat.  Reduce your daily sodium intake. Most people with hypertension should eat less than 1,500 mg of sodium a day.  Do not drink alcohol if: ? Your health care provider tells you not to drink. ? You are pregnant, may be pregnant, or are planning to become pregnant.  If you drink alcohol: ? Limit how much you use to:  0-1 drink a day for women.  0-2 drinks a day for men. ? Be aware of how much alcohol is in your drink. In the [place] one drink equals one 12 oz bottle of beer (355 mL), one 5 oz glass of wine (148 mL), or one 1 oz glass of hard liquor (44 mL).   Lifestyle  Work with your health care provider to maintain a healthy body weight or to lose weight. Ask what an ideal weight is for you.  Get  at least 30 minutes of exercise most days of the week. Activities may include walking, swimming, or biking.  Include exercise to strengthen your muscles (resistance exercise), such as Pilates or lifting weights, as part of your weekly exercise routine. Try to do these types of exercises for 30 minutes at least 3 days a week.  Do not use any products that contain nicotine or tobacco, such as cigarettes, e-cigarettes, and chewing tobacco. If you need help quitting, ask your health care provider.  Monitor your blood pressure at home as told by your health care provider.  Keep all follow-up visits as told by your health care provider. This is important.   Medicines  Take over-the-counter and prescription medicines only as told by your health care provider. Follow directions carefully. Blood pressure medicines must be taken as prescribed.  Do not skip doses of blood pressure medicine. Doing this puts you at risk for problems and can make the medicine less effective.  Ask your health care provider about side effects or reactions  to medicines that you should watch for. Contact a health care provider if you:  Think you are having a reaction to a medicine you are taking.  Have headaches that keep coming back (recurring).  Feel dizzy.  Have swelling in your ankles.  Have trouble with your vision. Get help right away if you:  Develop a severe headache or confusion.  Have unusual weakness or numbness.  Feel faint.  Have severe pain in your chest or abdomen.  Vomit repeatedly.  Have trouble breathing. Summary  Hypertension is when the force of blood pumping through your arteries is too strong. If this condition is not controlled, it may put you at risk for serious complications.  Your personal target blood pressure may vary depending on your medical conditions, your age, and other factors. For most people, a normal blood pressure is less than 120/80.  Hypertension is treated with  lifestyle changes, medicines, or a combination of both. Lifestyle changes include losing weight, eating a healthy, low-sodium diet, exercising more, and limiting alcohol. This information is not intended to replace advice given to you by your health care provider. Make sure you discuss any questions you have with your health care provider. Document Revised: 07/05/2018 Document Reviewed: 07/05/2018 Elsevier Patient Education  2021 [employer].

## 2020-12-26 LAB — BMP8+EGFR
BUN/Creatinine Ratio: 18 (ref 12–28)
BUN: 16 mg/dL (ref 8–27)
CO2: 21 mmol/L (ref 20–29)
Calcium: 10 mg/dL (ref 8.7–10.3)
Chloride: 101 mmol/L (ref 96–106)
Creatinine, Ser: 0.87 mg/dL (ref 0.57–1.00)
GFR calc Af Amer: 84 mL/min/{1.73_m2} (ref >59)
GFR calc non Af Amer: 73 mL/min/{1.73_m2} (ref >59)
Glucose: 84 mg/dL (ref 65–99)
Potassium: 3.9 mmol/L (ref 3.5–5.2)
Sodium: 140 mmol/L (ref 134–144)

## 2020-12-26 LAB — HEMOGLOBIN A1C
Est. average glucose Bld gHb Est-mCnc: 137 mg/dL
Hgb A1c MFr Bld: 6.4 % — ABNORMAL HIGH (ref 4.8–5.6)

## 2020-12-29 ENCOUNTER — Encounter: Payer: Self-pay | Admitting: Internal Medicine

## 2021-01-14 ENCOUNTER — Other Ambulatory Visit: Payer: Self-pay | Admitting: Internal Medicine

## 2021-01-14 DIAGNOSIS — Z1231 Encounter for screening mammogram for malignant neoplasm of breast: Secondary | ICD-10-CM

## 2021-03-02 ENCOUNTER — Ambulatory Visit
Admission: RE | Admit: 2021-03-02 | Discharge: 2021-03-02 | Disposition: A | Discharge status: Home / Self Care | Payer: 59 | Source: Ambulatory Visit

## 2021-03-02 ENCOUNTER — Ambulatory Visit: Payer: 59 | Admitting: Internal Medicine

## 2021-03-02 ENCOUNTER — Other Ambulatory Visit: Payer: Self-pay

## 2021-03-02 DIAGNOSIS — Z1231 Encounter for screening mammogram for malignant neoplasm of breast: Secondary | ICD-10-CM

## 2021-04-30 ENCOUNTER — Encounter: Payer: Self-pay | Admitting: Internal Medicine

## 2021-04-30 ENCOUNTER — Ambulatory Visit: Payer: 59 | Admitting: Internal Medicine

## 2021-04-30 ENCOUNTER — Other Ambulatory Visit: Payer: Self-pay

## 2021-04-30 VITALS — BP 134/72 | HR 75 | Temp 97.9°F | Ht 67.0 in | Wt 190.4 lb

## 2021-04-30 DIAGNOSIS — E7800 Pure hypercholesterolemia, unspecified: Secondary | ICD-10-CM

## 2021-04-30 DIAGNOSIS — I10 Essential (primary) hypertension: Secondary | ICD-10-CM | POA: Diagnosis not present

## 2021-04-30 DIAGNOSIS — Z1211 Encounter for screening for malignant neoplasm of colon: Secondary | ICD-10-CM

## 2021-04-30 DIAGNOSIS — R7309 Other abnormal glucose: Secondary | ICD-10-CM

## 2021-04-30 MED ORDER — METOPROLOL SUCCINATE ER 25 MG PO TB24: 25.0000 mg | ORAL_TABLET | Freq: Every day | ORAL | 2 refills | Status: DC

## 2021-04-30 MED ORDER — ATORVASTATIN CALCIUM 40 MG PO TABS: 40.0000 mg | ORAL_TABLET | Freq: Every day | ORAL | 2 refills | Status: DC

## 2021-04-30 MED ORDER — VALSARTAN 160 MG PO TABS: 160.0000 mg | ORAL_TABLET | Freq: Every day | ORAL | 2 refills | Status: DC

## 2021-04-30 NOTE — Progress Notes (Signed)
I,Katawbba Wiggins,acting as a Education administrator for Maximino Greenland, MD.,have documented all relevant documentation on the behalf of Maximino Greenland, MD,as directed by  Maximino Greenland, MD while in the presence of Maximino Greenland, MD.  This visit occurred during the SARS-CoV-2 public health emergency.  Safety protocols were in place, including screening questions prior to the visit, additional usage of staff PPE, and extensive cleaning of exam room while observing appropriate contact time as indicated for disinfecting solutions.  Subjective:     Patient ID: Jennifer Mercer , female    DOB: 13-Jan-1961 , 60 y.o.   MRN: [id number]   Chief Complaint  Patient presents with   Hypertension    HPI  The patient is here today for a blood pressure follow-up.  She reports compliance with meds. She denies headaches, chest pain and shortness of breath.   Hypertension This is a chronic problem. The current episode started more than 1 year ago. The problem has been gradually improving since onset. The problem is uncontrolled. Pertinent negatives include no blurred vision, chest pain or shortness of breath. Risk factors for coronary artery disease include dyslipidemia and post-menopausal state. Past treatments include angiotensin blockers.    Past Medical History:  Diagnosis Date   Atrophic vaginitis    Hyperlipidemia    Hypertension      Family History  Problem Relation Age of Onset   Cancer Mother      Current Outpatient Medications:    Ascorbic Acid (VITAMIN C) 1000 MG tablet, Take 1,000 mg by mouth daily., Disp: , Rfl:    ECHINACEA PO, Take by mouth., Disp: , Rfl:    ELDERBERRY PO, Take by mouth., Disp: , Rfl:    NON FORMULARY, Women's ultra mega energy & metabolism, Disp: , Rfl:    Zinc Sulfate (ZINC 15 PO), Take by mouth., Disp: , Rfl:    atorvastatin (LIPITOR) 40 MG tablet, Take 1 tablet (40 mg total) by mouth daily., Disp: 90 tablet, Rfl: 2   metoprolol succinate (TOPROL-XL) 25 MG 24 hr tablet,  Take 1 tablet (25 mg total) by mouth daily., Disp: 90 tablet, Rfl: 2   valsartan (DIOVAN) 160 MG tablet, Take 1 tablet (160 mg total) by mouth daily., Disp: 90 tablet, Rfl: 2   No Known Allergies   Review of Systems  Constitutional: Negative.   Eyes:  Negative for blurred vision.  Respiratory: Negative.  Negative for shortness of breath.   Cardiovascular: Negative.  Negative for chest pain.  Gastrointestinal: Negative.   Neurological: Negative.   Psychiatric/Behavioral: Negative.      Today's Vitals   04/30/21 1515  BP: 134/72  Pulse: 75  Temp: 97.9 F (36.6 C)  TempSrc: Oral  Weight: 190 lb 6.4 oz (86.4 kg)  Height: 5' 7" (1.702 m)   Body mass index is 29.82 kg/m.  Wt Readings from Last 3 Encounters:  04/30/21 190 lb 6.4 oz (86.4 kg)  12/25/20 187 lb 3.2 oz (84.9 kg)  09/02/20 191 lb 6.4 oz (86.8 kg)    BP Readings from Last 3 Encounters:  04/30/21 134/72  12/25/20 128/76  09/02/20 132/70    Objective:  Physical Exam Vitals and nursing note reviewed.  Constitutional:      Appearance: Normal appearance.  HENT:     Head: Normocephalic and atraumatic.     Nose:     Comments: Masked     Mouth/Throat:     Comments: Masked  Eyes:     Extraocular Movements: Extraocular movements intact.  Cardiovascular:     Rate and Rhythm: Normal rate and regular rhythm.     Heart sounds: Normal heart sounds.  Pulmonary:     Effort: Pulmonary effort is normal.     Breath sounds: Normal breath sounds.  Skin:    General: Skin is warm.  Neurological:     General: No focal deficit present.     Mental Status: She is alert.  Psychiatric:        Mood and Affect: Mood normal.        Behavior: Behavior normal.        Assessment And Plan:     1. Essential hypertension, benign Comments: Chronic, fair control. She is advised to follow low sodium diet. She will c/w valsartan and metoprolol for now.  - CMP14+EGFR  2. Pure hypercholesterolemia Comments: She will c/w atorvastatin  40mg daily. Encouraged to follow heart healthy diet, specifically Mediterranean diet.   3. Other abnormal glucose Comments: Her a1c has been elevated in the past. I will recheck today. Encouraged to decrease intake of sweetened beverages, including diet drinks.   4. Screen for colon cancer Comments: I will refer her to GI for CRC screening. She is in agreement with treatment plan.  - Ambulatory referral to Gastroenterology   Patient was given opportunity to ask questions. Patient verbalized understanding of the plan and was able to repeat key elements of the plan. All questions were answered to their satisfaction.   I, Robyn N Sanders, MD, have reviewed all documentation for this visit. The documentation on 04/30/21 for the exam, diagnosis, procedures, and orders are all accurate and complete.   IF YOU HAVE BEEN REFERRED TO A SPECIALIST, IT MAY TAKE 1-2 WEEKS TO SCHEDULE/PROCESS THE REFERRAL. IF YOU HAVE NOT HEARD FROM US/SPECIALIST IN TWO WEEKS, PLEASE GIVE US A CALL AT [phone].   THE PATIENT IS ENCOURAGED TO PRACTICE SOCIAL DISTANCING DUE TO THE COVID-19 PANDEMIC.    

## 2021-04-30 NOTE — Patient Instructions (Signed)

## 2021-09-10 ENCOUNTER — Ambulatory Visit (INDEPENDENT_AMBULATORY_CARE_PROVIDER_SITE_OTHER): Payer: 59 | Admitting: Internal Medicine

## 2021-09-10 ENCOUNTER — Encounter: Payer: Self-pay | Admitting: Internal Medicine

## 2021-09-10 ENCOUNTER — Other Ambulatory Visit: Payer: Self-pay

## 2021-09-10 VITALS — BP 136/88 | HR 100 | Temp 94.0°F | Ht 67.0 in | Wt 189.8 lb

## 2021-09-10 DIAGNOSIS — R0982 Postnasal drip: Secondary | ICD-10-CM

## 2021-09-10 DIAGNOSIS — Z0000 Encounter for general adult medical examination without abnormal findings: Secondary | ICD-10-CM | POA: Diagnosis not present

## 2021-09-10 DIAGNOSIS — Z1211 Encounter for screening for malignant neoplasm of colon: Secondary | ICD-10-CM | POA: Diagnosis not present

## 2021-09-10 DIAGNOSIS — I10 Essential (primary) hypertension: Secondary | ICD-10-CM

## 2021-09-10 DIAGNOSIS — E7800 Pure hypercholesterolemia, unspecified: Secondary | ICD-10-CM | POA: Insufficient documentation

## 2021-09-10 DIAGNOSIS — Z23 Encounter for immunization: Secondary | ICD-10-CM

## 2021-09-10 DIAGNOSIS — R7309 Other abnormal glucose: Secondary | ICD-10-CM | POA: Insufficient documentation

## 2021-09-10 LAB — POCT URINALYSIS DIPSTICK
Bilirubin, UA: NEGATIVE
Blood, UA: NEGATIVE
Glucose, UA: NEGATIVE
Leukocytes, UA: NEGATIVE
Nitrite, UA: NEGATIVE
Protein, UA: NEGATIVE
Spec Grav, UA: 1.025 (ref 1.010–1.025)
Urobilinogen, UA: 2 E.U./dL — AB
pH, UA: 6.5 (ref 5.0–8.0)

## 2021-09-10 LAB — POCT UA - MICROALBUMIN
Albumin/Creatinine Ratio, Urine, POC: <30
Creatinine, POC: 300 mg/dL
Microalbumin Ur, POC: 30 mg/L

## 2021-09-10 MED ORDER — SHINGRIX 50 MCG/0.5ML IM SUSR: 0.5000 mL | Freq: Once | INTRAMUSCULAR | 0 refills | Status: AC

## 2021-09-10 NOTE — Progress Notes (Signed)
I,Katawbba Wiggins,acting as a Education administrator for Maximino Greenland, MD.,have documented all relevant documentation on the behalf of Maximino Greenland, MD,as directed by  Maximino Greenland, MD while in the presence of Maximino Greenland, MD.  This visit occurred during the SARS-CoV-2 public health emergency.  Safety protocols were in place, including screening questions prior to the visit, additional usage of staff PPE, and extensive cleaning of exam room while observing appropriate contact time as indicated for disinfecting solutions.  Subjective:     Patient ID: Jennifer Mercer , female    DOB: 11/17/1960 , 60 y.o.   MRN: [id number]   Chief Complaint  Patient presents with   Annual Exam   Hypertension    HPI  Patient is here today for full physical. Patient states she's waiting on [facility] for a pap appt. She reports compliance with meds. She denies headaches, chest pain and shortness of breath.   Hypertension This is a chronic problem. The current episode started more than 1 year ago. The problem has been gradually improving since onset. The problem is uncontrolled. Pertinent negatives include no blurred vision, chest pain or shortness of breath. Risk factors for coronary artery disease include dyslipidemia and post-menopausal state. Past treatments include angiotensin blockers. There are no compliance problems.     Past Medical History:  Diagnosis Date   Atrophic vaginitis    Hyperlipidemia    Hypertension      Family History  Problem Relation Age of Onset   Cancer Mother      Current Outpatient Medications:    atorvastatin (LIPITOR) 40 MG tablet, Take 1 tablet (40 mg total) by mouth daily., Disp: 90 tablet, Rfl: 2   ECHINACEA PO, Take by mouth., Disp: , Rfl:    ELDERBERRY PO, Take by mouth., Disp: , Rfl:    metoprolol succinate (TOPROL-XL) 25 MG 24 hr tablet, Take 1 tablet (25 mg total) by mouth daily., Disp: 90 tablet, Rfl: 2   NON FORMULARY, Women's ultra mega energy &  metabolism, Disp: , Rfl:    valsartan (DIOVAN) 160 MG tablet, Take 1 tablet (160 mg total) by mouth daily., Disp: 90 tablet, Rfl: 2   Ascorbic Acid (VITAMIN C) 1000 MG tablet, Take 1,000 mg by mouth daily. (Patient not taking: Reported on 09/10/2021), Disp: , Rfl:    Zinc Sulfate (ZINC 15 PO), Take by mouth. (Patient not taking: Reported on 09/10/2021), Disp: , Rfl:    No Known Allergies    The patient states she uses post menopausal status for birth control. Last LMP was No LMP recorded. Patient is postmenopausal.. Negative for Dysmenorrhea. Negative for: breast discharge, breast lump(s), breast pain and breast self exam. Associated symptoms include abnormal vaginal bleeding. Pertinent negatives include abnormal bleeding (hematology), anxiety, decreased libido, depression, difficulty falling sleep, dyspareunia, history of infertility, nocturia, sexual dysfunction, sleep disturbances, urinary incontinence, urinary urgency, vaginal discharge and vaginal itching. Diet regular.The patient states her exercise level is  intermittent.  . The patient's tobacco use is:  Social History   Tobacco Use  Smoking Status Never  Smokeless Tobacco Never  . She has been exposed to passive smoke. The patient's alcohol use is:  Social History   Substance and Sexual Activity  Alcohol Use No    Review of Systems  Constitutional: Negative.   HENT:  Positive for postnasal drip.   Eyes: Negative.  Negative for blurred vision.  Respiratory: Negative.  Negative for shortness of breath.   Cardiovascular: Negative.  Negative for chest pain.  Gastrointestinal: Negative.   Endocrine: Negative.   Genitourinary: Negative.   Musculoskeletal: Negative.   Skin: Negative.   Allergic/Immunologic: Negative.   Neurological: Negative.   Hematological: Negative.   Psychiatric/Behavioral: Negative.      Today's Vitals   09/10/21 1046 09/10/21 1222  BP: (!) 146/88 136/88  Pulse: 100   Temp: 98.2 F (36.8 C) (!) 94 F  (34.4 C)  Weight: 189 lb 12.8 oz (86.1 kg)   Height: 5' 7"  (1.702 m)    Body mass index is 29.73 kg/m.  Wt Readings from Last 3 Encounters:  09/10/21 189 lb 12.8 oz (86.1 kg)  04/30/21 190 lb 6.4 oz (86.4 kg)  12/25/20 187 lb 3.2 oz (84.9 kg)    BP Readings from Last 3 Encounters:  09/10/21 136/88  04/30/21 134/72  12/25/20 128/76    Objective:  Physical Exam Vitals and nursing note reviewed.  Constitutional:      Appearance: Normal appearance.  HENT:     Head: Normocephalic and atraumatic.     Right Ear: Tympanic membrane, ear canal and external ear normal.     Left Ear: Tympanic membrane, ear canal and external ear normal.     Nose:     Comments: Masked     Mouth/Throat:     Comments: Masked  Eyes:     Extraocular Movements: Extraocular movements intact.     Conjunctiva/sclera: Conjunctivae normal.     Pupils: Pupils are equal, round, and reactive to light.  Cardiovascular:     Rate and Rhythm: Normal rate and regular rhythm.     Pulses: Normal pulses.     Heart sounds: Normal heart sounds.  Pulmonary:     Effort: Pulmonary effort is normal.     Breath sounds: Normal breath sounds.  Chest:  Breasts:    Tanner Score is 5.     Right: Normal.     Left: Normal.  Abdominal:     General: Abdomen is flat. Bowel sounds are normal.     Palpations: Abdomen is soft.  Genitourinary:    Comments: deferred Musculoskeletal:        General: Normal range of motion.     Cervical back: Normal range of motion and neck supple.  Skin:    General: Skin is warm and dry.  Neurological:     General: No focal deficit present.     Mental Status: She is alert and oriented to person, place, and time.  Psychiatric:        Mood and Affect: Mood normal.        Behavior: Behavior normal.        Assessment And Plan:     1. Routine general medical examination at health care facility Comments: A full exam was performed. Importance of monthly self breast exams was discussed with the  patient. She was given flu vaccine today. PATIENT IS ADVISED TO GET 30-45 MINUTES REGULAR EXERCISE NO LESS THAN FOUR TO FIVE DAYS PER WEEK - BOTH WEIGHTBEARING EXERCISES AND AEROBIC ARE RECOMMENDED.  PATIENT IS ADVISED TO FOLLOW A HEALTHY DIET WITH AT LEAST SIX FRUITS/VEGGIES PER DAY, DECREASE INTAKE OF RED MEAT, AND TO INCREASE FISH INTAKE TO TWO DAYS PER WEEK.  MEATS/FISH SHOULD NOT BE FRIED, BAKED OR BROILED IS PREFERABLE.  IT IS ALSO IMPORTANT TO CUT BACK ON YOUR SUGAR INTAKE. PLEASE AVOID ANYTHING WITH ADDED SUGAR, CORN SYRUP OR OTHER SWEETENERS. IF YOU MUST USE A SWEETENER, YOU CAN TRY STEVIA. IT IS ALSO IMPORTANT TO AVOID ARTIFICIALLY SWEETENERS  AND DIET BEVERAGES. LASTLY, I SUGGEST WEARING SPF 50 SUNSCREEN ON EXPOSED PARTS AND ESPECIALLY WHEN IN THE DIRECT SUNLIGHT FOR AN EXTENDED PERIOD OF TIME.  PLEASE AVOID FAST FOOD RESTAURANTS AND INCREASE YOUR WATER INTAKE.  - Hemoglobin A1c - CBC - CMP14+EGFR - Lipid panel  2. Essential hypertension, benign Comments: Chronic, uncontrolled. Repeat BP is 136/88, this was improved. Goal BP is less than 130/80.  EKG performed, NSR w/ LAE, ectopic ventricular beat. She is encouraged to follow low sodium diet. She will f/u in 4 months. - POCT Urinalysis Dipstick (81002) - POCT UA - Microalbumin - EKG 12-Lead  3. Postnasal drip Comments: She is encouraged to try loratadine 38m daily. She will let me know if her sx persist. Also advised to avoid dairy x 2 weeks to see if her sx improve.   4. Need for vaccination Comments: She was given flu vaccine.   5. Special screening for malignant neoplasm of colon Comments: I will refer her to GI for CRC screening again, does not want to see initial provider.  - Ambulatory referral to Gastroenterology  Patient was given opportunity to ask questions. Patient verbalized understanding of the plan and was able to repeat key elements of the plan. All questions were answered to their satisfaction.   I, RMaximino Greenland MD, have reviewed all documentation for this visit. The documentation on 09/10/21 for the exam, diagnosis, procedures, and orders are all accurate and complete.  THE PATIENT IS ENCOURAGED TO PRACTICE SOCIAL DISTANCING DUE TO THE COVID-19 PANDEMIC.

## 2021-09-10 NOTE — Patient Instructions (Signed)
Health Maintenance, Female Adopting a healthy lifestyle and getting preventive care are important in promoting health and wellness. Ask your health care provider about: The right schedule for you to have regular tests and exams. Things you can do on your own to prevent diseases and keep yourself healthy. What should I know about diet, weight, and exercise? Eat a healthy diet  Eat a diet that includes plenty of vegetables, fruits, low-fat dairy products, and lean protein. Do not eat a lot of foods that are high in solid fats, added sugars, or sodium. Maintain a healthy weight Body mass index (BMI) is used to identify weight problems. It estimates body fat based on height and weight. Your health care provider can help determine your BMI and help you achieve or maintain a healthy weight. Get regular exercise Get regular exercise. This is one of the most important things you can do for your health. Most adults should: Exercise for at least 150 minutes each week. The exercise should increase your heart rate and make you sweat (moderate-intensity exercise). Do strengthening exercises at least twice a week. This is in addition to the moderate-intensity exercise. Spend less time sitting. Even light physical activity can be beneficial. Watch cholesterol and blood lipids Have your blood tested for lipids and cholesterol at 61 years of age, then have this test every 5 years. Have your cholesterol levels checked more often if: Your lipid or cholesterol levels are high. You are older than 61 years of age. You are at high risk for heart disease. What should I know about cancer screening? Depending on your health history and family history, you may need to have cancer screening at various ages. This may include screening for: Breast cancer. Cervical cancer. Colorectal cancer. Skin cancer. Lung cancer. What should I know about heart disease, diabetes, and high blood pressure? Blood pressure and heart  disease High blood pressure causes heart disease and increases the risk of stroke. This is more likely to develop in people who have high blood pressure readings, are of African descent, or are overweight. Have your blood pressure checked: Every 3-5 years if you are 18-39 years of age. Every year if you are 40 years old or older. Diabetes Have regular diabetes screenings. This checks your fasting blood sugar level. Have the screening done: Once every three years after age 40 if you are at a normal weight and have a low risk for diabetes. More often and at a younger age if you are overweight or have a high risk for diabetes. What should I know about preventing infection? Hepatitis B If you have a higher risk for hepatitis B, you should be screened for this virus. Talk with your health care provider to find out if you are at risk for hepatitis B infection. Hepatitis C Testing is recommended for: Everyone born from 1945 through 1965. Anyone with known risk factors for hepatitis C. Sexually transmitted infections (STIs) Get screened for STIs, including gonorrhea and chlamydia, if: You are sexually active and are younger than 61 years of age. You are older than 61 years of age and your health care provider tells you that you are at risk for this type of infection. Your sexual activity has changed since you were last screened, and you are at increased risk for chlamydia or gonorrhea. Ask your health care provider if you are at risk. Ask your health care provider about whether you are at high risk for HIV. Your health care provider may recommend a prescription medicine   to help prevent HIV infection. If you choose to take medicine to prevent HIV, you should first get tested for HIV. You should then be tested every 3 months for as long as you are taking the medicine. Pregnancy If you are about to stop having your period (premenopausal) and you may become pregnant, seek counseling before you get  pregnant. Take 400 to 800 micrograms (mcg) of folic acid every day if you become pregnant. Ask for birth control (contraception) if you want to prevent pregnancy. Osteoporosis and menopause Osteoporosis is a disease in which the bones lose minerals and strength with aging. This can result in bone fractures. If you are 65 years old or older, or if you are at risk for osteoporosis and fractures, ask your health care provider if you should: Be screened for bone loss. Take a calcium or vitamin D supplement to lower your risk of fractures. Be given hormone replacement therapy (HRT) to treat symptoms of menopause. Follow these instructions at home: Lifestyle Do not use any products that contain nicotine or tobacco, such as cigarettes, e-cigarettes, and chewing tobacco. If you need help quitting, ask your health care provider. Do not use street drugs. Do not share needles. Ask your health care provider for help if you need support or information about quitting drugs. Alcohol use Do not drink alcohol if: Your health care provider tells you not to drink. You are pregnant, may be pregnant, or are planning to become pregnant. If you drink alcohol: Limit how much you use to 0-1 drink a day. Limit intake if you are breastfeeding. Be aware of how much alcohol is in your drink. In the [place] one drink equals one 12 oz bottle of beer (355 mL), one 5 oz glass of wine (148 mL), or one 1 oz glass of hard liquor (44 mL). General instructions Schedule regular health, dental, and eye exams. Stay current with your vaccines. Tell your health care provider if: You often feel depressed. You have ever been abused or do not feel safe at home. Summary Adopting a healthy lifestyle and getting preventive care are important in promoting health and wellness. Follow your health care provider's instructions about healthy diet, exercising, and getting tested or screened for diseases. Follow your health care provider's  instructions on monitoring your cholesterol and blood pressure. This information is not intended to replace advice given to you by your health care provider. Make sure you discuss any questions you have with your health care provider. Document Revised: 01/02/2021 Document Reviewed: 10/18/2018 Elsevier Patient Education  2022 [employer]  

## 2021-09-11 LAB — CBC
Hematocrit: 40.1 % (ref 34.0–46.6)
Hemoglobin: 14 g/dL (ref 11.1–15.9)
MCH: 29.6 pg (ref 26.6–33.0)
MCHC: 34.9 g/dL (ref 31.5–35.7)
MCV: 85 fL (ref 79–97)
Platelets: 193 10*3/uL (ref 150–450)
RBC: 4.73 x10E6/uL (ref 3.77–5.28)
RDW: 11.9 % (ref 11.7–15.4)
WBC: 5 10*3/uL (ref 3.4–10.8)

## 2021-09-11 LAB — CMP14+EGFR
ALT: 29 IU/L (ref 0–32)
AST: 26 IU/L (ref 0–40)
Albumin/Globulin Ratio: 1.9 (ref 1.2–2.2)
Albumin: 4.9 g/dL — ABNORMAL HIGH (ref 3.8–4.8)
Alkaline Phosphatase: 103 IU/L (ref 44–121)
BUN/Creatinine Ratio: 17 (ref 12–28)
BUN: 12 mg/dL (ref 8–27)
Bilirubin Total: 0.8 mg/dL (ref 0.0–1.2)
CO2: 23 mmol/L (ref 20–29)
Calcium: 9.8 mg/dL (ref 8.7–10.3)
Chloride: 100 mmol/L (ref 96–106)
Creatinine, Ser: 0.69 mg/dL (ref 0.57–1.00)
Globulin, Total: 2.6 g/dL (ref 1.5–4.5)
Glucose: 98 mg/dL (ref 70–99)
Potassium: 4 mmol/L (ref 3.5–5.2)
Sodium: 138 mmol/L (ref 134–144)
Total Protein: 7.5 g/dL (ref 6.0–8.5)
eGFR: 99 mL/min/{1.73_m2} (ref >59)

## 2021-09-11 LAB — HEMOGLOBIN A1C
Est. average glucose Bld gHb Est-mCnc: 140 mg/dL
Hgb A1c MFr Bld: 6.5 % — ABNORMAL HIGH (ref 4.8–5.6)

## 2021-09-11 LAB — LIPID PANEL
Chol/HDL Ratio: 3.9 ratio (ref 0.0–4.4)
Cholesterol, Total: 204 mg/dL — ABNORMAL HIGH (ref 100–199)
HDL: 52 mg/dL (ref >39)
LDL Chol Calc (NIH): 120 mg/dL — ABNORMAL HIGH (ref 0–99)
Triglycerides: 181 mg/dL — ABNORMAL HIGH (ref 0–149)
VLDL Cholesterol Cal: 32 mg/dL (ref 5–40)

## 2021-11-25 ENCOUNTER — Encounter: Payer: Self-pay | Admitting: Internal Medicine

## 2021-11-25 LAB — HM COLONOSCOPY

## 2021-11-30 ENCOUNTER — Telehealth: Payer: Self-pay

## 2021-11-30 NOTE — Telephone Encounter (Signed)
I left the pt a message that I  was calling to let her know the office needed her waist circumference to add to her physician results form so that the office can fax it to her employer.

## 2022-01-21 ENCOUNTER — Ambulatory Visit: Payer: 59 | Admitting: Internal Medicine

## 2022-01-29 ENCOUNTER — Other Ambulatory Visit: Payer: Self-pay | Admitting: Internal Medicine

## 2022-01-29 DIAGNOSIS — Z1231 Encounter for screening mammogram for malignant neoplasm of breast: Secondary | ICD-10-CM

## 2022-02-03 ENCOUNTER — Ambulatory Visit: Payer: 59 | Admitting: Internal Medicine

## 2022-02-06 ENCOUNTER — Other Ambulatory Visit: Payer: Self-pay | Admitting: Internal Medicine

## 2022-02-23 ENCOUNTER — Ambulatory Visit: Payer: 59 | Admitting: Internal Medicine

## 2022-03-10 ENCOUNTER — Encounter: Payer: Self-pay | Admitting: Internal Medicine

## 2022-03-10 ENCOUNTER — Ambulatory Visit: Payer: 59 | Admitting: Internal Medicine

## 2022-03-10 VITALS — BP 150/94 | HR 113 | Temp 98.5°F | Ht 66.8 in | Wt 187.8 lb

## 2022-03-10 DIAGNOSIS — Z6829 Body mass index (BMI) 29.0-29.9, adult: Secondary | ICD-10-CM | POA: Diagnosis not present

## 2022-03-10 DIAGNOSIS — R7303 Prediabetes: Secondary | ICD-10-CM | POA: Diagnosis not present

## 2022-03-10 DIAGNOSIS — I10 Essential (primary) hypertension: Secondary | ICD-10-CM | POA: Diagnosis not present

## 2022-03-10 NOTE — Progress Notes (Signed)
?Rich Brave Llittleton,acting as a Education administrator for Maximino Greenland, MD.,have documented all relevant documentation on the behalf of Maximino Greenland, MD,as directed by  Maximino Greenland, MD while in the presence of Maximino Greenland, MD.  ?This visit occurred during the SARS-CoV-2 public health emergency.  Safety protocols were in place, including screening questions prior to the visit, additional usage of staff PPE, and extensive cleaning of exam room while observing appropriate contact time as indicated for disinfecting solutions. ? ?Subjective:  ?  ? Patient ID: Jennifer Mercer , female    DOB: [birth year] , 61 y.o.   MRN: [id number] ? ? ?Chief Complaint  ?Patient presents with  ? Hypertension  ? ? ?HPI ? ?The patient is here today for a blood pressure and pre-diabetes check. Patient reports she and her husband are now on a high protein diet they started a week ago. She also plans to increase her exercise.  ? ?Hypertension ?This is a chronic problem. The current episode started more than 1 year ago. The problem has been gradually improving since onset. The problem is uncontrolled. Pertinent negatives include no blurred vision, chest pain or shortness of breath. Risk factors for coronary artery disease include dyslipidemia and post-menopausal state. Past treatments include angiotensin blockers.   ? ?Past Medical History:  ?Diagnosis Date  ? Atrophic vaginitis   ? Hyperlipidemia   ? Hypertension   ?  ? ?Family History  ?Problem Relation Age of Onset  ? Cancer Mother   ? ? ? ?Current Outpatient Medications:  ?  Ascorbic Acid (VITAMIN C) 1000 MG tablet, Take 1,000 mg by mouth daily., Disp: , Rfl:  ?  atorvastatin (LIPITOR) 40 MG tablet, TAKE 1 TABLET BY MOUTH EVERY DAY, Disp: 90 tablet, Rfl: 2 ?  ELDERBERRY PO, Take by mouth., Disp: , Rfl:  ?  metoprolol succinate (TOPROL-XL) 25 MG 24 hr tablet, TAKE 1 TABLET (25 MG TOTAL) BY MOUTH DAILY., Disp: 90 tablet, Rfl: 2 ?  valsartan (DIOVAN) 160 MG tablet, TAKE 1 TABLET BY MOUTH  EVERY DAY, Disp: 90 tablet, Rfl: 2 ?  dapagliflozin propanediol (FARXIGA) 5 MG TABS tablet, Take 1 tablet (5 mg total) by mouth daily before breakfast., Disp: 90 tablet, Rfl: 1 ?  ECHINACEA PO, Take by mouth. (Patient not taking: Reported on 03/10/2022), Disp: , Rfl:  ?  NON FORMULARY, Women's ultra mega energy & metabolism, Disp: , Rfl:   ? ?No Known Allergies  ? ?Review of Systems  ?Constitutional: Negative.   ?Eyes:  Negative for blurred vision.  ?Respiratory: Negative.  Negative for shortness of breath.   ?Cardiovascular: Negative.  Negative for chest pain.  ?Gastrointestinal: Negative.   ?Neurological: Negative.   ?Psychiatric/Behavioral: Negative.     ? ?Today's Vitals  ? 03/10/22 1603 03/10/22 1643  ?BP: (!) 142/80 (!) 150/94  ?Pulse: (!) 113   ?Temp: 98.5 ?F (36.9 ?C)   ?Weight: 187 lb 12.8 oz (85.2 kg)   ?Height: 5' 6.8" (1.697 m)   ?PainSc: 0-No pain   ? ?Body mass index is 29.59 kg/m?.  ?Wt Readings from Last 3 Encounters:  ?03/10/22 187 lb 12.8 oz (85.2 kg)  ?09/10/21 189 lb 12.8 oz (86.1 kg)  ?04/30/21 190 lb 6.4 oz (86.4 kg)  ?  ?BP Readings from Last 3 Encounters:  ?03/10/22 (!) 150/94  ?09/10/21 136/88  ?04/30/21 134/72  ? ? ?Objective:  ?Physical Exam ?Vitals and nursing note reviewed.  ?Constitutional:   ?   Appearance: Normal appearance.  ?HENT:  ?  Head: Normocephalic and atraumatic.  ?Eyes:  ?   Extraocular Movements: Extraocular movements intact.  ?Cardiovascular:  ?   Rate and Rhythm: Normal rate and regular rhythm.  ?   Heart sounds: Normal heart sounds.  ?Pulmonary:  ?   Effort: Pulmonary effort is normal.  ?   Breath sounds: Normal breath sounds.  ?Musculoskeletal:  ?   Cervical back: Normal range of motion.  ?Skin: ?   General: Skin is warm.  ?Neurological:  ?   General: No focal deficit present.  ?   Mental Status: She is alert.  ?Psychiatric:     ?   Mood and Affect: Mood normal.     ?   Behavior: Behavior normal.  ?   ?Assessment And Plan:  ?   ?1. Essential hypertension,  benign ?Comments: Uncontrolled, reports having some normal BP readings at home <140/80. Goal BP<130/80. I will add another medication after reviewing her meds, likely a diuretic. ?- BMP8+EGFR ? ?2. Prediabetes ?Comments: Her a1c has been elevated in the past. I will recheck an a1c today. Pt advised that a1c>6.5 is indicative of diabetes.  ?- Hemoglobin A1c ? ?3. BMI 29.0-29.9,adult ?Comments: She is encouraged to aim for at least 150 minutes of exercise per week.  ?  ?Patient was given opportunity to ask questions. Patient verbalized understanding of the plan and was able to repeat key elements of the plan. All questions were answered to their satisfaction.  ? ?I, Maximino Greenland, MD, have reviewed all documentation for this visit. The documentation on 03/20/22 for the exam, diagnosis, procedures, and orders are all accurate and complete.  ? ?IF YOU HAVE BEEN REFERRED TO A SPECIALIST, IT MAY TAKE 1-2 WEEKS TO SCHEDULE/PROCESS THE REFERRAL. IF YOU HAVE NOT HEARD FROM US/SPECIALIST IN TWO WEEKS, PLEASE GIVE [place] A CALL AT [phone].  ? ?THE PATIENT IS ENCOURAGED TO PRACTICE SOCIAL DISTANCING DUE TO THE COVID-19 PANDEMIC.   ?

## 2022-03-10 NOTE — Patient Instructions (Signed)
Hypertension, Adult ?Hypertension is another name for high blood pressure. High blood pressure forces your heart to work harder to pump blood. This can cause problems over time. ?There are two numbers in a blood pressure reading. There is a top number (systolic) over a bottom number (diastolic). It is best to have a blood pressure that is below 120/80. ?What are the causes? ?The cause of this condition is not known. Some other conditions can lead to high blood pressure. ?What increases the risk? ?Some lifestyle factors can make you more likely to develop high blood pressure: ?Smoking. ?Not getting enough exercise or physical activity. ?Being overweight. ?Having too much fat, sugar, calories, or salt (sodium) in your diet. ?Drinking too much alcohol. ?Other risk factors include: ?Having any of these conditions: ?Heart disease. ?Diabetes. ?High cholesterol. ?Kidney disease. ?Obstructive sleep apnea. ?Having a family history of high blood pressure and high cholesterol. ?Age. The risk increases with age. ?Stress. ?What are the signs or symptoms? ?High blood pressure may not cause symptoms. Very high blood pressure (hypertensive crisis) may cause: ?Headache. ?Fast or uneven heartbeats (palpitations). ?Shortness of breath. ?Nosebleed. ?Vomiting or feeling like you may vomit (nauseous). ?Changes in how you see. ?Very bad chest pain. ?Feeling dizzy. ?Seizures. ?How is this treated? ?This condition is treated by making healthy lifestyle changes, such as: ?Eating healthy foods. ?Exercising more. ?Drinking less alcohol. ?Your doctor may prescribe medicine if lifestyle changes do not help enough and if: ?Your top number is above 130. ?Your bottom number is above 80. ?Your personal target blood pressure may vary. ?Follow these instructions at home: ?Eating and drinking ? ?If told, follow the DASH eating plan. To follow this plan: ?Fill one half of your plate at each meal with fruits and vegetables. ?Fill one fourth of your plate  at each meal with whole grains. Whole grains include whole-wheat pasta, brown rice, and whole-grain bread. ?Eat or drink low-fat dairy products, such as skim milk or low-fat yogurt. ?Fill one fourth of your plate at each meal with low-fat (lean) proteins. Low-fat proteins include fish, chicken without skin, eggs, beans, and tofu. ?Avoid fatty meat, cured and processed meat, or chicken with skin. ?Avoid pre-made or processed food. ?Limit the amount of salt in your diet to less than 1,500 mg each day. ?Do not drink alcohol if: ?Your doctor tells you not to drink. ?You are pregnant, may be pregnant, or are planning to become pregnant. ?If you drink alcohol: ?Limit how much you have to: ?0-1 drink a day for women. ?0-2 drinks a day for men. ?Know how much alcohol is in your drink. In the [place] one drink equals one 12 oz bottle of beer (355 mL), one 5 oz glass of wine (148 mL), or one 1? oz glass of hard liquor (44 mL). ?Lifestyle ? ?Work with your doctor to stay at a healthy weight or to lose weight. Ask your doctor what the best weight is for you. ?Get at least 30 minutes of exercise that causes your heart to beat faster (aerobic exercise) most days of the week. This may include walking, swimming, or biking. ?Get at least 30 minutes of exercise that strengthens your muscles (resistance exercise) at least 3 days a week. This may include lifting weights or doing Pilates. ?Do not smoke or use any products that contain nicotine or tobacco. If you need help quitting, ask your doctor. ?Check your blood pressure at home as told by your doctor. ?Keep all follow-up visits. ?Medicines ?Take over-the-counter and prescription medicines   only as told by your doctor. Follow directions carefully. ?Do not skip doses of blood pressure medicine. The medicine does not work as well if you skip doses. Skipping doses also puts you at risk for problems. ?Ask your doctor about side effects or reactions to medicines that you should watch  for. ?Contact a doctor if: ?You think you are having a reaction to the medicine you are taking. ?You have headaches that keep coming back. ?You feel dizzy. ?You have swelling in your ankles. ?You have trouble with your vision. ?Get help right away if: ?You get a very bad headache. ?You start to feel mixed up (confused). ?You feel weak or numb. ?You feel faint. ?You have very bad pain in your: ?Chest. ?Belly (abdomen). ?You vomit more than once. ?You have trouble breathing. ?These symptoms may be an emergency. Get help right away. Call 911. ?Do not wait to see if the symptoms will go away. ?Do not drive yourself to the hospital. ?Summary ?Hypertension is another name for high blood pressure. ?High blood pressure forces your heart to work harder to pump blood. ?For most people, a normal blood pressure is less than 120/80. ?Making healthy choices can help lower blood pressure. If your blood pressure does not get lower with healthy choices, you may need to take medicine. ?This information is not intended to replace advice given to you by your health care provider. Make sure you discuss any questions you have with your health care provider. ?Document Revised: 08/13/2021 Document Reviewed: 08/13/2021 ?Elsevier Patient Education ? 2023 [employer] ? ?

## 2022-03-11 LAB — BMP8+EGFR
BUN/Creatinine Ratio: 23 (ref 12–28)
BUN: 18 mg/dL (ref 8–27)
CO2: 20 mmol/L (ref 20–29)
Calcium: 10.4 mg/dL — ABNORMAL HIGH (ref 8.7–10.3)
Chloride: 102 mmol/L (ref 96–106)
Creatinine, Ser: 0.79 mg/dL (ref 0.57–1.00)
Glucose: 101 mg/dL — ABNORMAL HIGH (ref 70–99)
Potassium: 4.1 mmol/L (ref 3.5–5.2)
Sodium: 139 mmol/L (ref 134–144)
eGFR: 85 mL/min/{1.73_m2} (ref >59)

## 2022-03-11 LAB — HEMOGLOBIN A1C
Est. average glucose Bld gHb Est-mCnc: 157 mg/dL
Hgb A1c MFr Bld: 7.1 % — ABNORMAL HIGH (ref 4.8–5.6)

## 2022-03-12 ENCOUNTER — Other Ambulatory Visit: Payer: Self-pay

## 2022-03-12 MED ORDER — DAPAGLIFLOZIN PROPANEDIOL 5 MG PO TABS: 5.0000 mg | ORAL_TABLET | Freq: Every day | ORAL | 1 refills | Status: DC

## 2022-04-02 ENCOUNTER — Ambulatory Visit
Admission: RE | Admit: 2022-04-02 | Discharge: 2022-04-02 | Disposition: A | Discharge status: Home / Self Care | Payer: 59 | Source: Ambulatory Visit | Attending: Internal Medicine | Admitting: Internal Medicine

## 2022-04-02 DIAGNOSIS — Z1231 Encounter for screening mammogram for malignant neoplasm of breast: Secondary | ICD-10-CM

## 2022-05-05 ENCOUNTER — Ambulatory Visit: Payer: 59 | Admitting: Internal Medicine

## 2022-05-27 ENCOUNTER — Ambulatory Visit: Payer: 59 | Admitting: Internal Medicine

## 2022-06-10 ENCOUNTER — Ambulatory Visit: Payer: 59 | Admitting: Internal Medicine

## 2022-06-10 ENCOUNTER — Encounter: Payer: Self-pay | Admitting: Internal Medicine

## 2022-06-10 VITALS — BP 148/64 | HR 96 | Temp 98.7°F | Ht 66.8 in | Wt 177.0 lb

## 2022-06-10 DIAGNOSIS — M25511 Pain in right shoulder: Secondary | ICD-10-CM | POA: Diagnosis not present

## 2022-06-10 DIAGNOSIS — I10 Essential (primary) hypertension: Secondary | ICD-10-CM | POA: Diagnosis not present

## 2022-06-10 DIAGNOSIS — Z6827 Body mass index (BMI) 27.0-27.9, adult: Secondary | ICD-10-CM | POA: Diagnosis not present

## 2022-06-10 DIAGNOSIS — E1165 Type 2 diabetes mellitus with hyperglycemia: Secondary | ICD-10-CM | POA: Diagnosis not present

## 2022-06-10 NOTE — Progress Notes (Signed)
Jeri Cos Llittleton,acting as a Neurosurgeon for Gwynneth Aliment, MD.,have documented all relevant documentation on the behalf of Gwynneth Aliment, MD,as directed by  Gwynneth Aliment, MD while in the presence of Gwynneth Aliment, MD.    Subjective:     Patient ID: Jennifer Mercer , female    DOB: 04-19-1961 , 61 y.o.   MRN: [id number]   Chief Complaint  Patient presents with  . Prediabetes    HPI  She is here today for f/u HTN. She reports compliance with meds. She reports she is meal planning and working out with a Systems analyst. She reports she feels a lot better since making this lifestyle changes.   Hypertension This is a chronic problem. The current episode started more than 1 year ago. The problem has been gradually improving since onset. The problem is controlled. Pertinent negatives include no blurred vision, chest pain, palpitations or shortness of breath. Past treatments include beta blockers and angiotensin blockers. The current treatment provides moderate improvement.     Past Medical History:  Diagnosis Date  . Atrophic vaginitis   . Hyperlipidemia   . Hypertension      Family History  Problem Relation Age of Onset  . Cancer Mother      Current Outpatient Medications:  .  Ascorbic Acid (VITAMIN C) 1000 MG tablet, Take 1,000 mg by mouth daily., Disp: , Rfl:  .  atorvastatin (LIPITOR) 40 MG tablet, TAKE 1 TABLET BY MOUTH EVERY DAY, Disp: 90 tablet, Rfl: 2 .  dapagliflozin propanediol (FARXIGA) 5 MG TABS tablet, Take 1 tablet (5 mg total) by mouth daily before breakfast., Disp: 90 tablet, Rfl: 1 .  ECHINACEA PO, Take by mouth., Disp: , Rfl:  .  metoprolol succinate (TOPROL-XL) 25 MG 24 hr tablet, TAKE 1 TABLET (25 MG TOTAL) BY MOUTH DAILY., Disp: 90 tablet, Rfl: 2 .  NON FORMULARY, Women's ultra mega energy & metabolism, Disp: , Rfl:  .  valsartan (DIOVAN) 160 MG tablet, TAKE 1 TABLET BY MOUTH EVERY DAY, Disp: 90 tablet, Rfl: 2 .  ELDERBERRY PO, Take by mouth.  (Patient not taking: Reported on 06/10/2022), Disp: , Rfl:    No Known Allergies   Review of Systems  Constitutional: Negative.   Eyes:  Negative for blurred vision.  Respiratory: Negative.  Negative for shortness of breath.   Cardiovascular: Negative.  Negative for chest pain and palpitations.  Gastrointestinal: Negative.   Musculoskeletal:  Positive for arthralgias.       She c/o R shoulder pain. There is pain with movement. Denies fall/trauma. Pain is exacerbated by pulling her shirt over her head.   Neurological: Negative.   Psychiatric/Behavioral: Negative.       Today's Vitals   06/10/22 1547 06/10/22 1600  BP: (!) 160/100 (!) 148/64  Pulse: 96   Temp: 98.7 F (37.1 C)   Weight: 177 lb (80.3 kg)   Height: 5' 6.8" (1.697 m)   PainSc: 0-No pain    Body mass index is 27.89 kg/m.  Wt Readings from Last 3 Encounters:  06/10/22 177 lb (80.3 kg)  03/10/22 187 lb 12.8 oz (85.2 kg)  09/10/21 189 lb 12.8 oz (86.1 kg)     Objective:  Physical Exam Vitals and nursing note reviewed.  Constitutional:      Appearance: Normal appearance.  HENT:     Head: Normocephalic and atraumatic.  Eyes:     Extraocular Movements: Extraocular movements intact.  Cardiovascular:     Rate and Rhythm: Normal rate  and regular rhythm.     Heart sounds: Normal heart sounds.  Pulmonary:     Effort: Pulmonary effort is normal.     Breath sounds: Normal breath sounds.  Musculoskeletal:     Cervical back: Normal range of motion.  Skin:    General: Skin is warm.  Neurological:     General: No focal deficit present.     Mental Status: She is alert.  Psychiatric:        Mood and Affect: Mood normal.        Behavior: Behavior normal.        Assessment And Plan:     1. BMI 27.0-27.9,adult  2. Prediabetes  3. Essential hypertension, benign   Patient was given opportunity to ask questions. Patient verbalized understanding of the plan and was able to repeat key elements of the plan. All  questions were answered to their satisfaction.   I, Gwynneth Aliment, MD, have reviewed all documentation for this visit. The documentation on 06/10/22 for the exam, diagnosis, procedures, and orders are all accurate and complete.   IF YOU HAVE BEEN REFERRED TO A SPECIALIST, IT MAY TAKE 1-2 WEEKS TO SCHEDULE/PROCESS THE REFERRAL. IF YOU HAVE NOT HEARD FROM US/SPECIALIST IN TWO WEEKS, PLEASE GIVE [place] A CALL AT [phone].   THE PATIENT IS ENCOURAGED TO PRACTICE SOCIAL DISTANCING DUE TO THE COVID-19 PANDEMIC.

## 2022-06-10 NOTE — Patient Instructions (Signed)
Hypertension, Adult ?Hypertension is another name for high blood pressure. High blood pressure forces your heart to work harder to pump blood. This can cause problems over time. ?There are two numbers in a blood pressure reading. There is a top number (systolic) over a bottom number (diastolic). It is best to have a blood pressure that is below 120/80. ?What are the causes? ?The cause of this condition is not known. Some other conditions can lead to high blood pressure. ?What increases the risk? ?Some lifestyle factors can make you more likely to develop high blood pressure: ?Smoking. ?Not getting enough exercise or physical activity. ?Being overweight. ?Having too much fat, sugar, calories, or salt (sodium) in your diet. ?Drinking too much alcohol. ?Other risk factors include: ?Having any of these conditions: ?Heart disease. ?Diabetes. ?High cholesterol. ?Kidney disease. ?Obstructive sleep apnea. ?Having a family history of high blood pressure and high cholesterol. ?Age. The risk increases with age. ?Stress. ?What are the signs or symptoms? ?High blood pressure may not cause symptoms. Very high blood pressure (hypertensive crisis) may cause: ?Headache. ?Fast or uneven heartbeats (palpitations). ?Shortness of breath. ?Nosebleed. ?Vomiting or feeling like you may vomit (nauseous). ?Changes in how you see. ?Very bad chest pain. ?Feeling dizzy. ?Seizures. ?How is this treated? ?This condition is treated by making healthy lifestyle changes, such as: ?Eating healthy foods. ?Exercising more. ?Drinking less alcohol. ?Your doctor may prescribe medicine if lifestyle changes do not help enough and if: ?Your top number is above 130. ?Your bottom number is above 80. ?Your personal target blood pressure may vary. ?Follow these instructions at home: ?Eating and drinking ? ?If told, follow the DASH eating plan. To follow this plan: ?Fill one half of your plate at each meal with fruits and vegetables. ?Fill one fourth of your plate  at each meal with whole grains. Whole grains include whole-wheat pasta, brown rice, and whole-grain bread. ?Eat or drink low-fat dairy products, such as skim milk or low-fat yogurt. ?Fill one fourth of your plate at each meal with low-fat (lean) proteins. Low-fat proteins include fish, chicken without skin, eggs, beans, and tofu. ?Avoid fatty meat, cured and processed meat, or chicken with skin. ?Avoid pre-made or processed food. ?Limit the amount of salt in your diet to less than 1,500 mg each day. ?Do not drink alcohol if: ?Your doctor tells you not to drink. ?You are pregnant, may be pregnant, or are planning to become pregnant. ?If you drink alcohol: ?Limit how much you have to: ?0-1 drink a day for women. ?0-2 drinks a day for men. ?Know how much alcohol is in your drink. In the [place] one drink equals one 12 oz bottle of beer (355 mL), one 5 oz glass of wine (148 mL), or one 1? oz glass of hard liquor (44 mL). ?Lifestyle ? ?Work with your doctor to stay at a healthy weight or to lose weight. Ask your doctor what the best weight is for you. ?Get at least 30 minutes of exercise that causes your heart to beat faster (aerobic exercise) most days of the week. This may include walking, swimming, or biking. ?Get at least 30 minutes of exercise that strengthens your muscles (resistance exercise) at least 3 days a week. This may include lifting weights or doing Pilates. ?Do not smoke or use any products that contain nicotine or tobacco. If you need help quitting, ask your doctor. ?Check your blood pressure at home as told by your doctor. ?Keep all follow-up visits. ?Medicines ?Take over-the-counter and prescription medicines   only as told by your doctor. Follow directions carefully. ?Do not skip doses of blood pressure medicine. The medicine does not work as well if you skip doses. Skipping doses also puts you at risk for problems. ?Ask your doctor about side effects or reactions to medicines that you should watch  for. ?Contact a doctor if: ?You think you are having a reaction to the medicine you are taking. ?You have headaches that keep coming back. ?You feel dizzy. ?You have swelling in your ankles. ?You have trouble with your vision. ?Get help right away if: ?You get a very bad headache. ?You start to feel mixed up (confused). ?You feel weak or numb. ?You feel faint. ?You have very bad pain in your: ?Chest. ?Belly (abdomen). ?You vomit more than once. ?You have trouble breathing. ?These symptoms may be an emergency. Get help right away. Call 911. ?Do not wait to see if the symptoms will go away. ?Do not drive yourself to the hospital. ?Summary ?Hypertension is another name for high blood pressure. ?High blood pressure forces your heart to work harder to pump blood. ?For most people, a normal blood pressure is less than 120/80. ?Making healthy choices can help lower blood pressure. If your blood pressure does not get lower with healthy choices, you may need to take medicine. ?This information is not intended to replace advice given to you by your health care provider. Make sure you discuss any questions you have with your health care provider. ?Document Revised: 08/13/2021 Document Reviewed: 08/13/2021 ?Elsevier Patient Education ? 2023 [employer] ? ?

## 2022-06-11 ENCOUNTER — Encounter: Payer: Self-pay | Admitting: Internal Medicine

## 2022-06-11 LAB — CMP14+EGFR
ALT: 24 IU/L (ref 0–32)
AST: 18 IU/L (ref 0–40)
Albumin/Globulin Ratio: 1.7 (ref 1.2–2.2)
Albumin: 4.9 g/dL (ref 3.9–4.9)
Alkaline Phosphatase: 98 IU/L (ref 44–121)
BUN/Creatinine Ratio: 19 (ref 12–28)
BUN: 14 mg/dL (ref 8–27)
Bilirubin Total: 0.8 mg/dL (ref 0.0–1.2)
CO2: 23 mmol/L (ref 20–29)
Calcium: 10.2 mg/dL (ref 8.7–10.3)
Chloride: 99 mmol/L (ref 96–106)
Creatinine, Ser: 0.75 mg/dL (ref 0.57–1.00)
Globulin, Total: 2.9 g/dL (ref 1.5–4.5)
Glucose: 89 mg/dL (ref 70–99)
Potassium: 3.9 mmol/L (ref 3.5–5.2)
Sodium: 137 mmol/L (ref 134–144)
Total Protein: 7.8 g/dL (ref 6.0–8.5)
eGFR: 90 mL/min/{1.73_m2} (ref >59)

## 2022-06-11 LAB — HEMOGLOBIN A1C
Est. average glucose Bld gHb Est-mCnc: 131 mg/dL
Hgb A1c MFr Bld: 6.2 % — ABNORMAL HIGH (ref 4.8–5.6)

## 2022-07-06 ENCOUNTER — Ambulatory Visit: Payer: 59

## 2022-07-06 VITALS — BP 120/90 | HR 89 | Temp 98.0°F | Ht 66.0 in | Wt 173.0 lb

## 2022-07-06 DIAGNOSIS — I10 Essential (primary) hypertension: Secondary | ICD-10-CM

## 2022-07-06 NOTE — Progress Notes (Deleted)
  I,Dwyane Dupree J Annastacia Duba,acting as a Neurosurgeon for TIMA-NURSE.,have documented all relevant documentation on the behalf of TIMA-NURSE,as directed by  [facility] while in the presence of TIMA-NURSE.    Subjective:     Patient ID: Jennifer Mercer , female    DOB: 1961/01/29 , 61 y.o.   MRN: [id number]   Chief Complaint  Patient presents with   Hypertension    HPI  Patient presents today for bp check and bmp. Patient didn't want to start amlodipine she wanted to discuss with family.  BP Readings from Last 3 Encounters: 07/06/22 : (!) 150/98 06/10/22 : (!) 148/64 03/10/22 : (!) 150/94    Hypertension     Past Medical History:  Diagnosis Date   Atrophic vaginitis    Hyperlipidemia    Hypertension      Family History  Problem Relation Age of Onset   Cancer Mother      Current Outpatient Medications:    Ascorbic Acid (VITAMIN C) 1000 MG tablet, Take 1,000 mg by mouth daily., Disp: , Rfl:    atorvastatin (LIPITOR) 40 MG tablet, TAKE 1 TABLET BY MOUTH EVERY DAY, Disp: 90 tablet, Rfl: 2   dapagliflozin propanediol (FARXIGA) 5 MG TABS tablet, Take 1 tablet (5 mg total) by mouth daily before breakfast., Disp: 90 tablet, Rfl: 1   ECHINACEA PO, Take by mouth., Disp: , Rfl:    ELDERBERRY PO, Take by mouth., Disp: , Rfl:    metoprolol succinate (TOPROL-XL) 25 MG 24 hr tablet, TAKE 1 TABLET (25 MG TOTAL) BY MOUTH DAILY., Disp: 90 tablet, Rfl: 2   NON FORMULARY, Women's ultra mega energy & metabolism, Disp: , Rfl:    valsartan (DIOVAN) 160 MG tablet, TAKE 1 TABLET BY MOUTH EVERY DAY, Disp: 90 tablet, Rfl: 2   No Known Allergies   Review of Systems   There were no vitals filed for this visit. There is no height or weight on file to calculate BMI.   Objective:  Physical Exam      Assessment And Plan:     There are no diagnoses linked to this encounter.    Patient was given opportunity to ask questions. Patient verbalized understanding of the plan and was able to repeat key elements  of the plan. All questions were answered to their satisfaction.  Marlyn Corporal, CMA   I, Marlyn Corporal, CMA, have reviewed all documentation for this visit. The documentation on 07/06/22 for the exam, diagnosis, procedures, and orders are all accurate and complete.   IF YOU HAVE BEEN REFERRED TO A SPECIALIST, IT MAY TAKE 1-2 WEEKS TO SCHEDULE/PROCESS THE REFERRAL. IF YOU HAVE NOT HEARD FROM US/SPECIALIST IN TWO WEEKS, PLEASE GIVE [place] A CALL AT [phone].   THE PATIENT IS ENCOURAGED TO PRACTICE SOCIAL DISTANCING DUE TO THE COVID-19 PANDEMIC.

## 2022-07-07 LAB — BMP8+EGFR
BUN/Creatinine Ratio: 18 (ref 12–28)
BUN: 15 mg/dL (ref 8–27)
CO2: 21 mmol/L (ref 20–29)
Calcium: 10.1 mg/dL (ref 8.7–10.3)
Chloride: 98 mmol/L (ref 96–106)
Creatinine, Ser: 0.83 mg/dL (ref 0.57–1.00)
Glucose: 82 mg/dL (ref 70–99)
Potassium: 4.3 mmol/L (ref 3.5–5.2)
Sodium: 139 mmol/L (ref 134–144)
eGFR: 80 mL/min/{1.73_m2} (ref >59)

## 2022-08-13 NOTE — Progress Notes (Signed)
Patient presents today for bp check and bmp. Patient didn't want to start amlodipine she wanted to discuss with family.  BP Readings from Last 3 Encounters: 07/06/22 : (!) 150/98 06/10/22 : (!) 148/64 03/10/22 : (!) 150/94

## 2022-08-26 ENCOUNTER — Other Ambulatory Visit: Payer: Self-pay | Admitting: Internal Medicine

## 2022-09-21 ENCOUNTER — Encounter: Payer: 59 | Admitting: Internal Medicine

## 2022-11-22 ENCOUNTER — Other Ambulatory Visit: Payer: Self-pay | Admitting: Internal Medicine

## 2022-12-09 ENCOUNTER — Encounter: Payer: Self-pay | Admitting: Internal Medicine

## 2022-12-23 ENCOUNTER — Other Ambulatory Visit: Payer: Self-pay | Admitting: Internal Medicine

## 2022-12-23 DIAGNOSIS — Z1231 Encounter for screening mammogram for malignant neoplasm of breast: Secondary | ICD-10-CM

## 2023-05-13 ENCOUNTER — Ambulatory Visit
Admission: RE | Admit: 2023-05-13 | Discharge: 2023-05-13 | Disposition: A | Discharge status: Home / Self Care | Payer: 59 | Source: Ambulatory Visit | Attending: Internal Medicine | Admitting: Internal Medicine

## 2023-05-13 DIAGNOSIS — Z1231 Encounter for screening mammogram for malignant neoplasm of breast: Secondary | ICD-10-CM

## 2023-09-02 IMAGING — MG MM DIGITAL SCREENING BILAT W/ TOMO AND CAD
6 of 10 series · 6 of 30 positions shown · non-contrast
Comparison: Previous exam(s).

CLINICAL DATA: Screening.

EXAM:
DIGITAL SCREENING BILATERAL MAMMOGRAM WITH TOMOSYNTHESIS AND CAD
TECHNIQUE: Bilateral screening digital craniocaudal and mediolateral oblique
mammograms were obtained. Bilateral screening digital breast
tomosynthesis was performed. The images were evaluated with
computer-aided detection.

[R CC synth-2D]
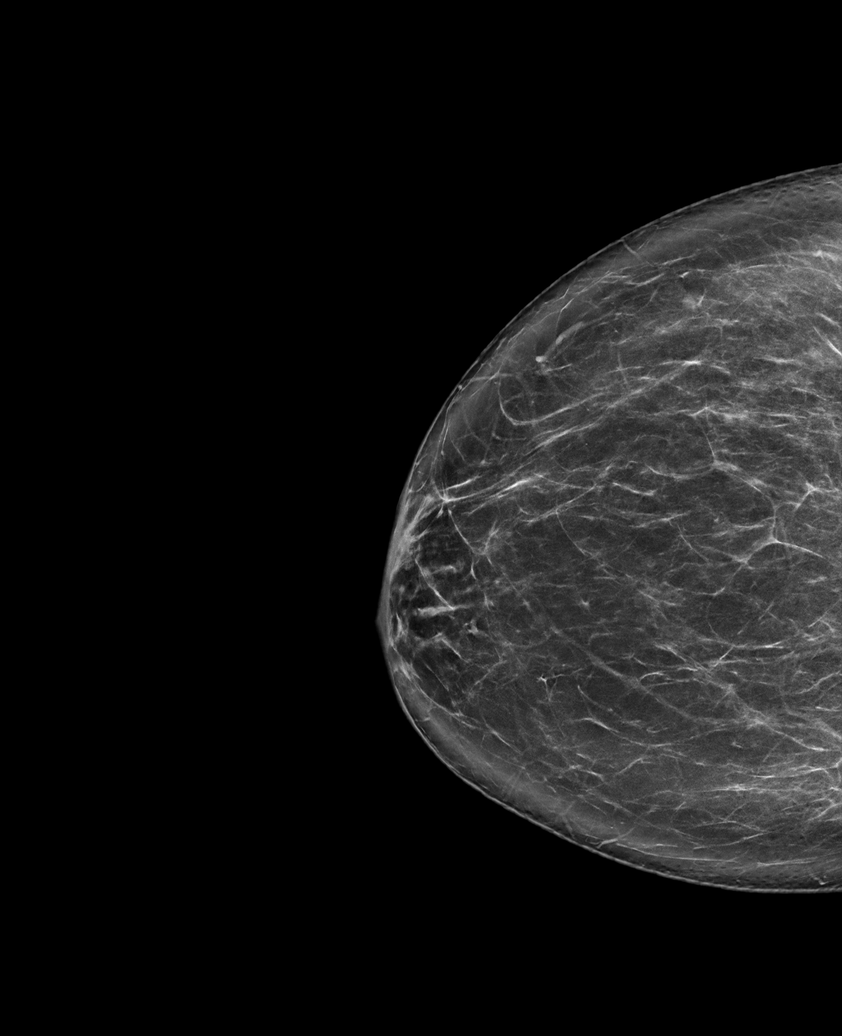

[L CC synth-2D]
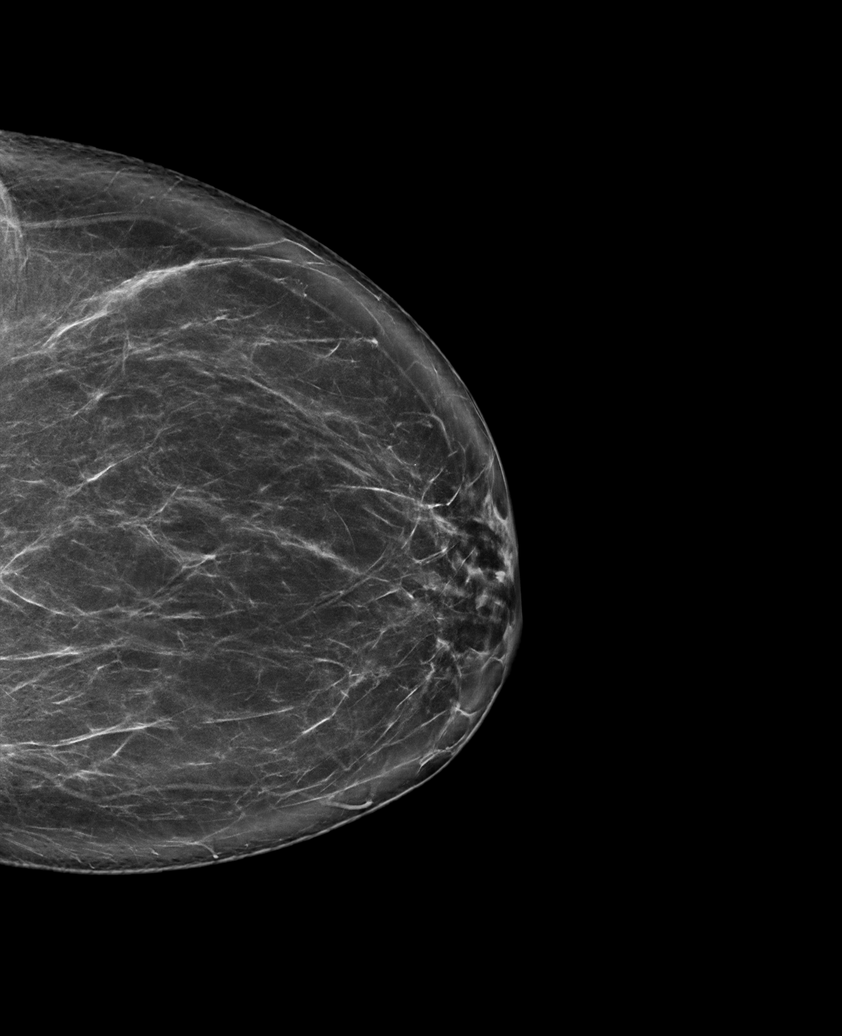

[R MLO synth-2D (1 of 2)]
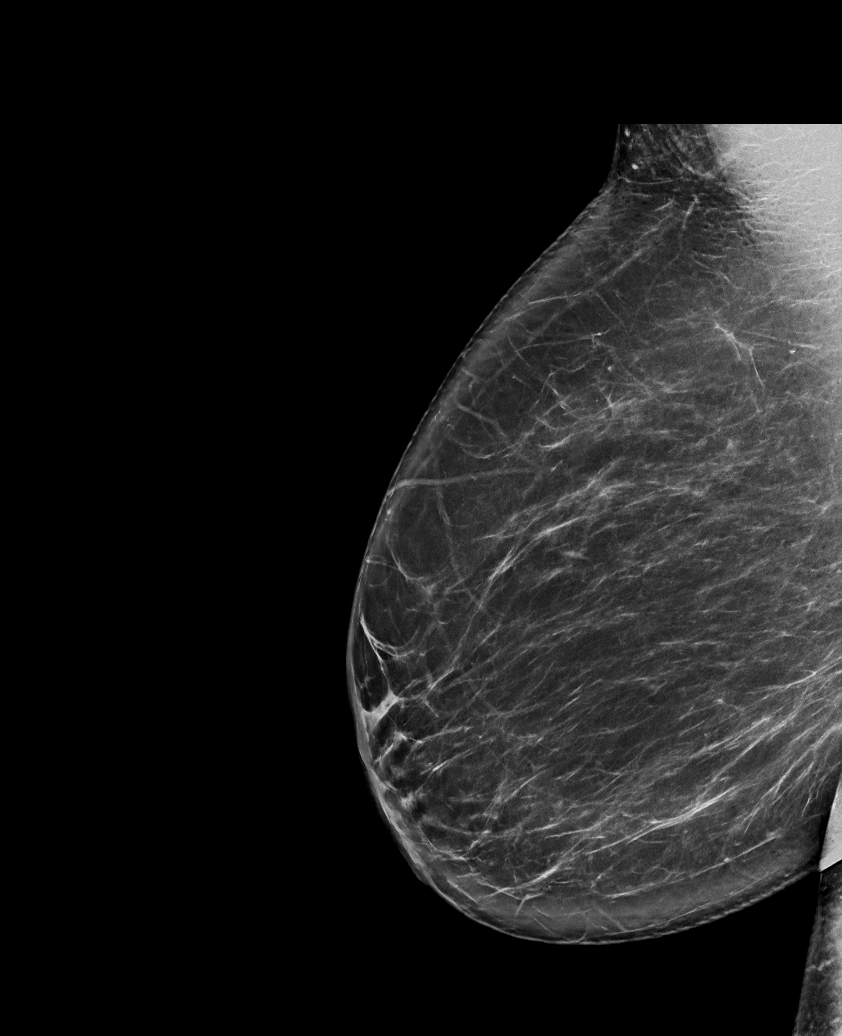

[L MLO synth-2D]
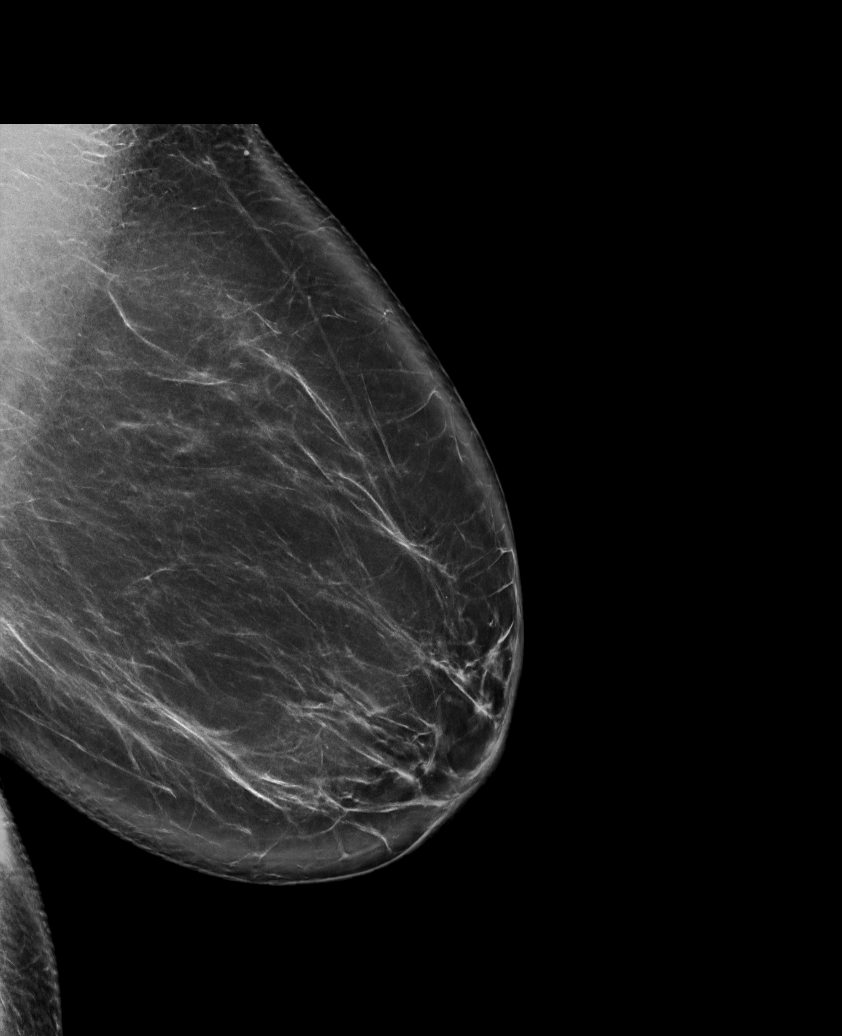

[R MLO synth-2D (2 of 2)]
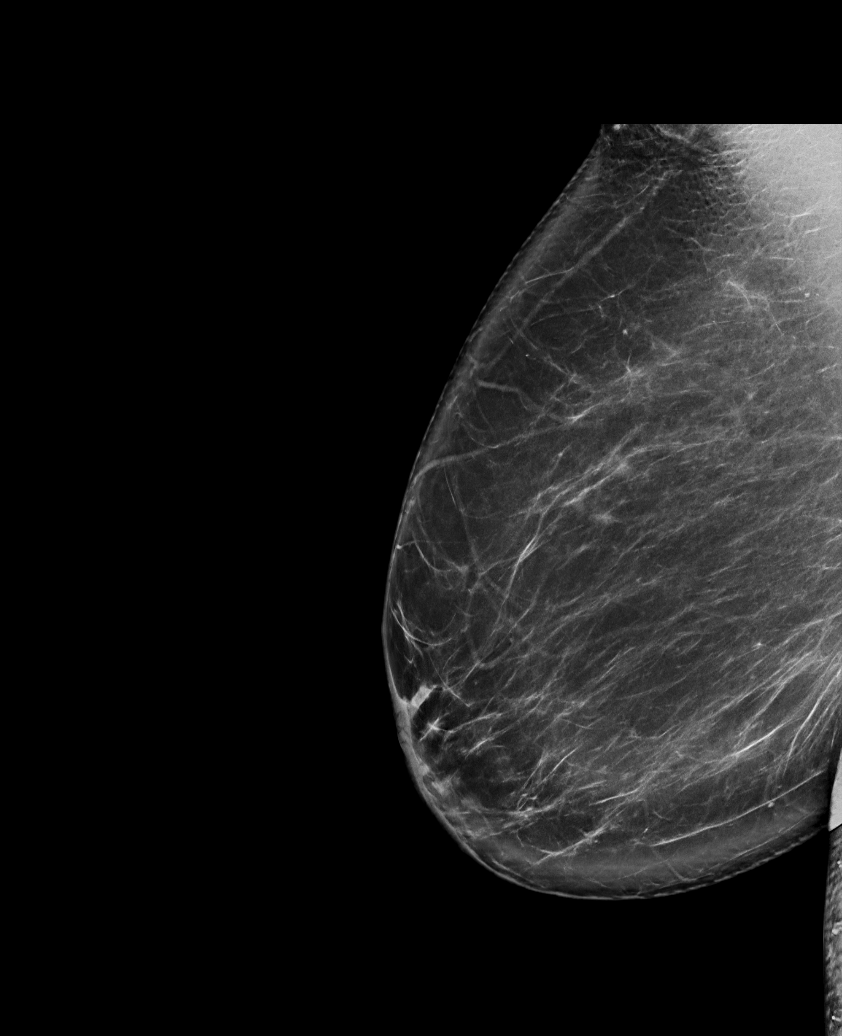

[L MLO tomo · tomo slice 51/102.0]
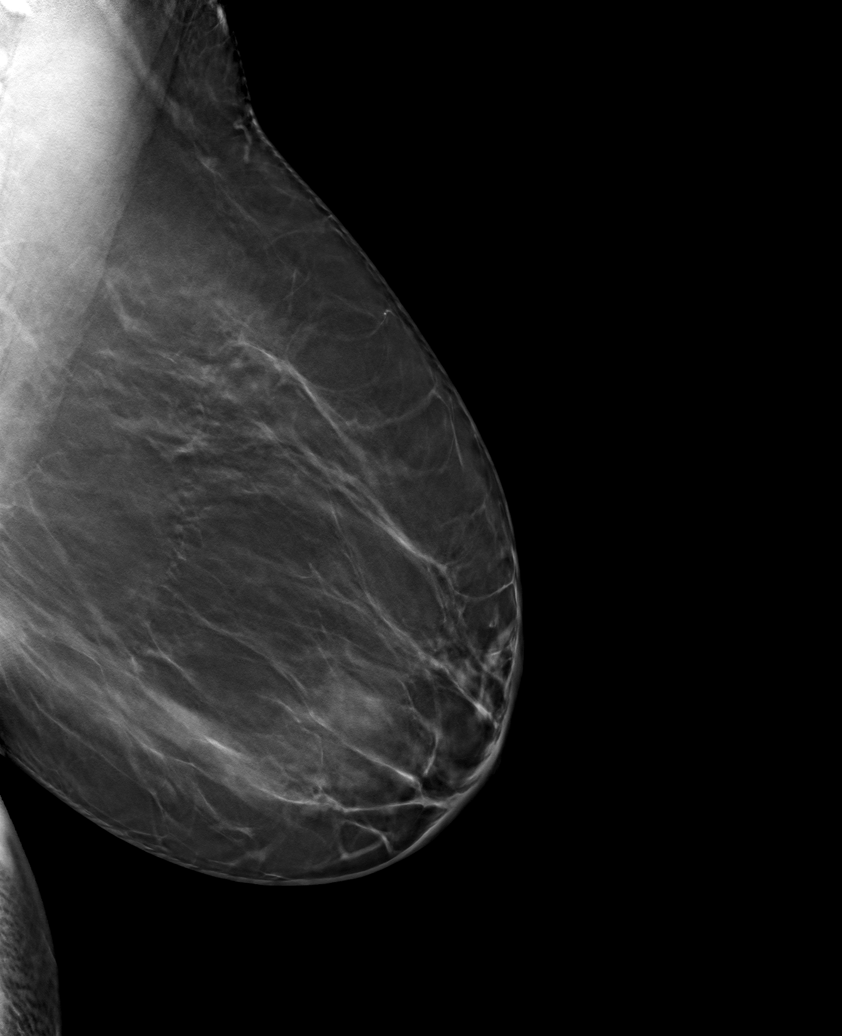

[6 of 30 positions shown; findings below may reference images not displayed]

ACR Breast Density Category b: There are scattered areas of
fibroglandular density.
FINDINGS: There are no findings suspicious for malignancy.
IMPRESSION: No mammographic evidence of malignancy. A result letter of this
screening mammogram will be mailed directly to the patient.

RECOMMENDATION:
Screening mammogram in one year. (Code:51-O-LD2)

BI-RADS CATEGORY  1: Negative.

## 2024-01-11 ENCOUNTER — Other Ambulatory Visit: Payer: Self-pay | Admitting: Internal Medicine

## 2024-01-11 DIAGNOSIS — Z1231 Encounter for screening mammogram for malignant neoplasm of breast: Secondary | ICD-10-CM

## 2024-08-06 ENCOUNTER — Ambulatory Visit
Admission: RE | Admit: 2024-08-06 | Discharge: 2024-08-06 | Disposition: A | Source: Ambulatory Visit | Attending: Internal Medicine | Admitting: Internal Medicine

## 2024-08-06 DIAGNOSIS — Z1231 Encounter for screening mammogram for malignant neoplasm of breast: Secondary | ICD-10-CM
